# Patient Record
Sex: Female | Born: 1960 | Race: White | Hispanic: No | Marital: Married | Smoking: Never smoker
Health system: Southern US, Community
[De-identification: ages and names within clinical notes are randomized; demographics above are authoritative.]

## PROBLEM LIST (undated history)

## (undated) DIAGNOSIS — T7840XA Allergy, unspecified, initial encounter: Secondary | ICD-10-CM

## (undated) DIAGNOSIS — M797 Fibromyalgia: Secondary | ICD-10-CM

## (undated) DIAGNOSIS — I493 Ventricular premature depolarization: Secondary | ICD-10-CM

## (undated) DIAGNOSIS — M3500 Sicca syndrome, unspecified: Secondary | ICD-10-CM

## (undated) DIAGNOSIS — K589 Irritable bowel syndrome without diarrhea: Secondary | ICD-10-CM

## (undated) HISTORY — PX: ABDOMINAL HYSTERECTOMY: SHX81

## (undated) HISTORY — PX: KNEE SURGERY: SHX244

---

## 2006-08-15 ENCOUNTER — Ambulatory Visit: Payer: Self-pay | Admitting: Internal Medicine

## 2006-09-03 ENCOUNTER — Ambulatory Visit: Payer: Self-pay | Admitting: Internal Medicine

## 2007-11-22 ENCOUNTER — Emergency Department (HOSPITAL_COMMUNITY): Admission: EM | Admit: 2007-11-22 | Discharge: 2007-11-22 | Payer: Self-pay | Admitting: Emergency Medicine

## 2011-01-05 ENCOUNTER — Ambulatory Visit: Payer: Self-pay | Admitting: Internal Medicine

## 2011-04-26 DIAGNOSIS — M3500 Sicca syndrome, unspecified: Secondary | ICD-10-CM | POA: Insufficient documentation

## 2012-07-10 ENCOUNTER — Ambulatory Visit: Payer: Self-pay | Admitting: Internal Medicine

## 2012-11-09 ENCOUNTER — Emergency Department: Payer: Self-pay | Admitting: Unknown Physician Specialty

## 2013-06-13 ENCOUNTER — Ambulatory Visit: Payer: Self-pay | Admitting: Gastroenterology

## 2013-08-19 ENCOUNTER — Ambulatory Visit: Payer: Self-pay | Admitting: Internal Medicine

## 2014-08-21 ENCOUNTER — Ambulatory Visit: Payer: Self-pay | Admitting: Internal Medicine

## 2015-10-13 ENCOUNTER — Other Ambulatory Visit: Payer: Self-pay | Admitting: Internal Medicine

## 2015-10-13 DIAGNOSIS — Z1231 Encounter for screening mammogram for malignant neoplasm of breast: Secondary | ICD-10-CM

## 2015-10-26 ENCOUNTER — Other Ambulatory Visit: Payer: Self-pay | Admitting: Internal Medicine

## 2015-10-26 ENCOUNTER — Ambulatory Visit
Admission: RE | Admit: 2015-10-26 | Discharge: 2015-10-26 | Disposition: A | Discharge status: Home / Self Care | Payer: 59 | Source: Ambulatory Visit | Attending: Internal Medicine | Admitting: Internal Medicine

## 2015-10-26 DIAGNOSIS — Z1231 Encounter for screening mammogram for malignant neoplasm of breast: Secondary | ICD-10-CM

## 2016-01-03 ENCOUNTER — Ambulatory Visit: Payer: 59 | Admitting: Physical Therapy

## 2016-01-06 ENCOUNTER — Encounter: Payer: Self-pay | Admitting: Physical Therapy

## 2016-01-06 NOTE — Therapy (Signed)
[place] [facility] MAIN [facility] SERVICES [address] [place], [place], [address] Phone: [phone]   Fax:  [phone]  Patient Details  Name: Traci Lester MRN: [id number] Date of Birth: 1961/02/25 Referring Provider:  Dr. Renard Matter   Encounter Date: 01/06/2016  Pt arrived to clinic on 01/06/16 at her scheduled appt time but she expressed to PT that she would like to postpone her PT because she is busy caring for her father who is medically ill. Pt was provided information about TEFL teacher as a resource to support her caretaking duties. Pt was also provided a yoga resource online  (www.healingmoves.com for Relax into Yoga video) because she stated her interest in using yoga as a way to manage her Sx.   Pt voiced understanding to call and set up another appt when she is ready to begin PT. No treatment nor charged were applied to her visit today.    Mariane Masters ,PT, DPT, E-RYT  01/06/2016, 3:11 PM  [facility] MAIN [facility] SERVICES [address] [place], [place], [address] Phone: [phone]   Fax:  [phone]

## 2016-07-02 ENCOUNTER — Emergency Department: Payer: 59

## 2016-07-02 ENCOUNTER — Encounter: Payer: Self-pay | Admitting: Emergency Medicine

## 2016-07-02 ENCOUNTER — Observation Stay
Admission: EM | Admit: 2016-07-02 | Discharge: 2016-07-04 | Disposition: A | Discharge status: Home / Self Care | Payer: 59 | Attending: Internal Medicine | Admitting: Internal Medicine

## 2016-07-02 DIAGNOSIS — Z90710 Acquired absence of both cervix and uterus: Secondary | ICD-10-CM | POA: Diagnosis not present

## 2016-07-02 DIAGNOSIS — R000 Tachycardia, unspecified: Secondary | ICD-10-CM | POA: Diagnosis not present

## 2016-07-02 DIAGNOSIS — E876 Hypokalemia: Secondary | ICD-10-CM | POA: Diagnosis not present

## 2016-07-02 DIAGNOSIS — I700 Atherosclerosis of aorta: Secondary | ICD-10-CM | POA: Insufficient documentation

## 2016-07-02 DIAGNOSIS — A0811 Acute gastroenteropathy due to Norwalk agent: Principal | ICD-10-CM | POA: Insufficient documentation

## 2016-07-02 DIAGNOSIS — E860 Dehydration: Secondary | ICD-10-CM

## 2016-07-02 DIAGNOSIS — Z79899 Other long term (current) drug therapy: Secondary | ICD-10-CM | POA: Insufficient documentation

## 2016-07-02 DIAGNOSIS — M797 Fibromyalgia: Secondary | ICD-10-CM | POA: Diagnosis not present

## 2016-07-02 DIAGNOSIS — K449 Diaphragmatic hernia without obstruction or gangrene: Secondary | ICD-10-CM | POA: Diagnosis not present

## 2016-07-02 DIAGNOSIS — F419 Anxiety disorder, unspecified: Secondary | ICD-10-CM | POA: Insufficient documentation

## 2016-07-02 DIAGNOSIS — Z791 Long term (current) use of non-steroidal anti-inflammatories (NSAID): Secondary | ICD-10-CM | POA: Diagnosis not present

## 2016-07-02 DIAGNOSIS — R509 Fever, unspecified: Secondary | ICD-10-CM | POA: Diagnosis not present

## 2016-07-02 DIAGNOSIS — R109 Unspecified abdominal pain: Secondary | ICD-10-CM

## 2016-07-02 DIAGNOSIS — R112 Nausea with vomiting, unspecified: Secondary | ICD-10-CM | POA: Diagnosis present

## 2016-07-02 DIAGNOSIS — K589 Irritable bowel syndrome without diarrhea: Secondary | ICD-10-CM | POA: Insufficient documentation

## 2016-07-02 DIAGNOSIS — K529 Noninfective gastroenteritis and colitis, unspecified: Secondary | ICD-10-CM | POA: Diagnosis present

## 2016-07-02 DIAGNOSIS — R197 Diarrhea, unspecified: Secondary | ICD-10-CM

## 2016-07-02 HISTORY — DX: Allergy, unspecified, initial encounter: T78.40XA

## 2016-07-02 HISTORY — DX: Ventricular premature depolarization: I49.3

## 2016-07-02 HISTORY — DX: Irritable bowel syndrome, unspecified: K58.9

## 2016-07-02 HISTORY — DX: Fibromyalgia: M79.7

## 2016-07-02 LAB — INFLUENZA PANEL BY PCR (TYPE A & B)
Influenza A By PCR: NEGATIVE
Influenza B By PCR: NEGATIVE

## 2016-07-02 LAB — COMPREHENSIVE METABOLIC PANEL
ALBUMIN: 4.4 g/dL (ref 3.5–5.0)
ALT: 38 U/L (ref 14–54)
AST: 37 U/L (ref 15–41)
Alkaline Phosphatase: 62 U/L (ref 38–126)
Anion gap: 9 (ref 5–15)
BUN: 15 mg/dL (ref 6–20)
CHLORIDE: 107 mmol/L (ref 101–111)
CO2: 22 mmol/L (ref 22–32)
CREATININE: 0.73 mg/dL (ref 0.44–1.00)
Calcium: 9.2 mg/dL (ref 8.9–10.3)
GFR calc Af Amer: >60 mL/min (ref >60)
GFR calc non Af Amer: >60 mL/min (ref >60)
Glucose, Bld: 127 mg/dL — ABNORMAL HIGH (ref 65–99)
Potassium: 3.3 mmol/L — ABNORMAL LOW (ref 3.5–5.1)
SODIUM: 138 mmol/L (ref 135–145)
Total Bilirubin: 0.3 mg/dL (ref 0.3–1.2)
Total Protein: 8.2 g/dL — ABNORMAL HIGH (ref 6.5–8.1)

## 2016-07-02 LAB — CBC
HEMATOCRIT: 41.6 % (ref 35.0–47.0)
Hemoglobin: 14.5 g/dL (ref 12.0–16.0)
MCH: 31.3 pg (ref 26.0–34.0)
MCHC: 34.9 g/dL (ref 32.0–36.0)
MCV: 89.5 fL (ref 80.0–100.0)
PLATELETS: 204 10*3/uL (ref 150–440)
RBC: 4.64 MIL/uL (ref 3.80–5.20)
RDW: 13.7 % (ref 11.5–14.5)
WBC: 8.8 10*3/uL (ref 3.6–11.0)

## 2016-07-02 LAB — LIPASE, BLOOD: LIPASE: 25 U/L (ref 11–51)

## 2016-07-02 LAB — LACTIC ACID, PLASMA: Lactic Acid, Venous: 4.4 mmol/L (ref 0.5–1.9)

## 2016-07-02 MED ORDER — ONDANSETRON HCL 4 MG/2ML IJ SOLN
4.0000 mg | Freq: Once | INTRAMUSCULAR | Status: AC | PRN
  Administered 2016-07-02: 4 mg via INTRAVENOUS
  Filled 2016-07-02: qty 2

## 2016-07-02 MED ORDER — SODIUM CHLORIDE 0.9 % IV BOLUS (SEPSIS)
1000.0000 mL | Freq: Once | INTRAVENOUS | Status: AC
  Administered 2016-07-02: 1000 mL via INTRAVENOUS

## 2016-07-02 MED ORDER — IOPAMIDOL (ISOVUE-300) INJECTION 61%: 15.0000 mL | INTRAVENOUS | Status: DC

## 2016-07-02 NOTE — ED Notes (Signed)
ED Provider at bedside. 

## 2016-07-02 NOTE — ED Provider Notes (Addendum)
[facility] Emergency Department Provider Note  ____________________________________________   I have reviewed the triage vital signs and the nursing notes.   HISTORY  Chief Complaint Nausea; Emesis; Diarrhea; and Abdominal Pain    HPI Traci Lester is a 56 y.o. female a history of irritable bowel syndrome, fibromyalgia, anxiety, presents today complaining of sudden onset abdominal pain, copious diarrhea and vomiting that started around 6 PM today. She states she is feeling pretty good up to that time. Positive fever. Multiple sick contacts in the community with norvirus, and positive sick contacts are noted. Patient has had no hematemesis or bright red blood per rectum. Patient was very anxious in the waiting room. She is breathing very quickly and had cramping in her fingers. That is completely resolved at this time. She denies any significant headache. Diffuse abdominal cramping is improved. Patient denies any antibiotics or recent travel.     Past Medical History:  Diagnosis Date  . Fibromyalgia   . IBS (irritable bowel syndrome)   . PVC (premature ventricular contraction)     There are no active problems to display for this patient.   Past Surgical History:  Procedure Laterality Date  . ABDOMINAL HYSTERECTOMY    . CESAREAN SECTION     times 3  . KNEE SURGERY      Prior to Admission medications   Medication Sig Start Date End Date Taking? Authorizing Provider  ALPRAZolam Prudy Feeler) 0.25 MG tablet Take 0.25 mg by mouth at bedtime as needed for anxiety or sleep.   Yes Historical Provider, MD  amitriptyline (ELAVIL) 25 MG tablet Take 25 mg by mouth at bedtime.   Yes Historical Provider, MD  conjugated estrogens (PREMARIN) vaginal cream Place 0.5 mg vaginally daily.   Yes Historical Provider, MD  ibuprofen (ADVIL,MOTRIN) 200 MG tablet Take 200 mg by mouth every 6 (six) hours as needed for fever, headache or moderate pain.   Yes Historical  Provider, MD  Multiple Vitamin (MULTIVITAMIN WITH MINERALS) TABS tablet Take 1 tablet by mouth daily.   Yes Historical Provider, MD  raloxifene (EVISTA) 60 MG tablet Take 60 mg by mouth daily.   Yes Historical Provider, MD  ranitidine (ZANTAC) 75 MG tablet Take 75 mg by mouth daily.    Yes Historical Provider, MD  zolpidem (AMBIEN) 10 MG tablet Take 10 mg by mouth at bedtime as needed for sleep.   Yes Historical Provider, MD    Allergies Codeine; Sulfa antibiotics; and Ultram [tramadol hcl]  Family History  Problem Relation Age of Onset  . Breast cancer Mother 21  . Breast cancer Maternal Aunt 61    Social History Social History  Substance Use Topics  . Smoking status: Never Smoker  . Smokeless tobacco: Never Used  . Alcohol use Not on file    Review of Systems Constitutional: No fever/chills Eyes: No visual changes. ENT: No sore throat. No stiff neck no neck pain Cardiovascular: Denies chest pain. Respiratory: Denies shortness of breath. Gastrointestinal:   See history of present illness Genitourinary: Negative for dysuria. Musculoskeletal: Negative lower extremity swelling Skin: Negative for rash. Neurological: Negative for severe headaches, focal weakness or numbness. 10-point ROS otherwise negative.  ____________________________________________   PHYSICAL EXAM:  VITAL SIGNS: ED Triage Vitals  Enc Vitals Group     BP 07/02/16 2134 128/80     Pulse Rate 07/02/16 2134 (!) 121     Resp 07/02/16 2134 (!) 26     Temp 07/02/16 2134 (!) 101.6 F (38.7 C)  Temp Source 07/02/16 2134 Oral     SpO2 07/02/16 2134 100 %     Weight 07/02/16 2135 140 lb (63.5 kg)     Height 07/02/16 2135 5\' 4"  (1.626 m)     Head Circumference --      Peak Flow --      Pain Score 07/02/16 2136 3     Pain Loc --      Pain Edu? --      Excl. in GC? --     Constitutional: Alert and oriented. She is medically quite stable appearing and nontoxic she is quite anxious Eyes: Conjunctivae  are normal. PERRL. EOMI. Head: Atraumatic. Nose: No congestion/rhinnorhea. Mouth/Throat: Mucous membranes are moist.  Oropharynx non-erythematous. Neck: No stridor.   Nontender with no meningismus Cardiovascular: Normal rate, regular rhythm. Grossly normal heart sounds.  Good peripheral circulation. Respiratory: Normal respiratory effort.  No retractions. Lungs CTAB. Abdominal: Soft and I'll diffuse nonfocal tenderness, nonsurgical abdomen. No distention. No guarding no rebound Back:  There is no focal tenderness or step off.  there is no midline tenderness there are no lesions noted. there is no CVA tenderness Musculoskeletal: No lower extremity tenderness, no upper extremity tenderness. No joint effusions, no DVT signs strong distal pulses no edema Neurologic:  Normal speech and language. No gross focal neurologic deficits are appreciated.  Skin:  Skin is warm, dry and intact. No rash noted. Psychiatric: Mood and affect are anxious. Speech and behavior are normal.  ____________________________________________   LABS (all labs ordered are listed, but only abnormal results are displayed)  Labs Reviewed  COMPREHENSIVE METABOLIC PANEL - Abnormal; Notable for the following:       Result Value   Potassium 3.3 (*)    Glucose, Bld 127 (*)    Total Protein 8.2 (*)    All other components within normal limits  LACTIC ACID, PLASMA - Abnormal; Notable for the following:    Lactic Acid, Venous 4.4 (*)    All other components within normal limits  GASTROINTESTINAL PANEL BY PCR, STOOL (REPLACES STOOL CULTURE)  LIPASE, BLOOD  CBC  URINALYSIS, COMPLETE (UACMP) WITH MICROSCOPIC  LACTIC ACID, PLASMA  INFLUENZA PANEL BY PCR (TYPE A & B)   ____________________________________________  EKG  I personally interpreted any EKGs ordered by me or triage  ____Sinus rhythm rate 99 bpm no acute ST elevation or depression normal axis ________________________________________  RADIOLOGY  I reviewed any  imaging ordered by me or triage that were performed during my shift and, if possible, patient and/or family made aware of any abnormal findings. ____________________________________________   PROCEDURES  Procedure(s) performed: None  Procedures  Critical Care performed: None  ____________________________________________   INITIAL IMPRESSION / ASSESSMENT AND PLAN / ED COURSE  Pertinent labs & imaging results that were available during my care of the patient were reviewed by me and considered in my medical decision making (see chart for details).  Patient with most likely viral gastroenteritis with fever vomiting and diarrhea. However, given elevated lactic acid CT scan was ordered by prior physician. If that is negative is my hope that we can get her safely home.  ----------------------------------------- 4:25 AM on 07/03/2016 -----------------------------------------  Workup is reassuring patient remains persistently mildly tachycardic despite 2 L of fluid. Her initial lactate was elevated but that is come back and certainly. I think some of that certainly could be treatable to her initial presentation. Certainly no evidence of ongoing sepsis I do believe this is a viral pathology resulting in diarrhea and  intravascular depletion with fever.    ____________________________________________   FINAL CLINICAL IMPRESSION(S) / ED DIAGNOSES  Final diagnoses:  None      This chart was dictated using voice recognition software.  Despite best efforts to proofread,  errors can occur which can change meaning.      Jeanmarie Plant, MD 07/02/16 2352    Jeanmarie Plant, MD 07/03/16 [id number]

## 2016-07-02 NOTE — ED Notes (Signed)
Patient had near syncopal episode in lobby. Pt reports she became hot, diaphoretic, dizzy, vision became dark and knees became weak

## 2016-07-02 NOTE — ED Triage Notes (Signed)
Patient with complaint of nausea, vomiting, diarrhea, fever and abdominal pain near her umbilicus that started around 18:00 tonight.

## 2016-07-02 NOTE — ED Notes (Signed)
Patient c/o N/V/D, abdominal pain, headache. Pt's bilateral wrists are contracted towards body.

## 2016-07-03 ENCOUNTER — Encounter: Payer: Self-pay | Admitting: Radiology

## 2016-07-03 ENCOUNTER — Emergency Department: Payer: 59

## 2016-07-03 DIAGNOSIS — K529 Noninfective gastroenteritis and colitis, unspecified: Secondary | ICD-10-CM | POA: Diagnosis present

## 2016-07-03 DIAGNOSIS — A0811 Acute gastroenteropathy due to Norwalk agent: Secondary | ICD-10-CM | POA: Diagnosis not present

## 2016-07-03 LAB — GASTROINTESTINAL PANEL BY PCR, STOOL (REPLACES STOOL CULTURE)

## 2016-07-03 LAB — URINALYSIS, COMPLETE (UACMP) WITH MICROSCOPIC
BACTERIA UA: NONE SEEN
BILIRUBIN URINE: NEGATIVE
Glucose, UA: NEGATIVE mg/dL
HGB URINE DIPSTICK: NEGATIVE
KETONES UR: 5 mg/dL — AB
LEUKOCYTES UA: NEGATIVE
NITRITE: NEGATIVE
PROTEIN: NEGATIVE mg/dL
SPECIFIC GRAVITY, URINE: 1.011 (ref 1.005–1.030)
pH: 8 (ref 5.0–8.0)

## 2016-07-03 LAB — LACTIC ACID, PLASMA: LACTIC ACID, VENOUS: 1.6 mmol/L (ref 0.5–1.9)

## 2016-07-03 LAB — TSH: TSH: 0.739 u[IU]/mL (ref 0.350–4.500)

## 2016-07-03 LAB — MAGNESIUM: Magnesium: 1.9 mg/dL (ref 1.7–2.4)

## 2016-07-03 MED ORDER — ACETAMINOPHEN 650 MG RE SUPP: 650.0000 mg | Freq: Four times a day (QID) | RECTAL | Status: DC | PRN

## 2016-07-03 MED ORDER — ONDANSETRON HCL 4 MG/2ML IJ SOLN: 4.0000 mg | Freq: Four times a day (QID) | INTRAMUSCULAR | Status: DC | PRN

## 2016-07-03 MED ORDER — FAMOTIDINE 20 MG PO TABS
20.0000 mg | ORAL_TABLET | Freq: Every day | ORAL | Status: DC
  Administered 2016-07-03 – 2016-07-04 (×2): 20 mg via ORAL
  Filled 2016-07-03 (×2): qty 1

## 2016-07-03 MED ORDER — SODIUM CHLORIDE 0.9 % IV BOLUS (SEPSIS)
1000.0000 mL | Freq: Once | INTRAVENOUS | Status: AC
  Administered 2016-07-03: 1000 mL via INTRAVENOUS

## 2016-07-03 MED ORDER — POTASSIUM CHLORIDE CRYS ER 20 MEQ PO TBCR
40.0000 meq | EXTENDED_RELEASE_TABLET | ORAL | Status: AC
  Administered 2016-07-03: 40 meq via ORAL
  Filled 2016-07-03: qty 2

## 2016-07-03 MED ORDER — ADULT MULTIVITAMIN W/MINERALS CH
1.0000 | ORAL_TABLET | Freq: Every day | ORAL | Status: DC
  Administered 2016-07-03 – 2016-07-04 (×2): 1 via ORAL
  Filled 2016-07-03 (×2): qty 1

## 2016-07-03 MED ORDER — ENOXAPARIN SODIUM 40 MG/0.4ML ~~LOC~~ SOLN
40.0000 mg | SUBCUTANEOUS | Status: DC
  Administered 2016-07-03: 10:00:00 40 mg via SUBCUTANEOUS
  Filled 2016-07-03 (×2): qty 0.4

## 2016-07-03 MED ORDER — ALPRAZOLAM 0.25 MG PO TABS: 0.2500 mg | ORAL_TABLET | Freq: Every evening | ORAL | Status: DC | PRN

## 2016-07-03 MED ORDER — PROMETHAZINE HCL 25 MG/ML IJ SOLN
12.5000 mg | Freq: Once | INTRAMUSCULAR | Status: AC
  Administered 2016-07-03: 12.5 mg via INTRAVENOUS
  Filled 2016-07-03: qty 1

## 2016-07-03 MED ORDER — RALOXIFENE HCL 60 MG PO TABS
60.0000 mg | ORAL_TABLET | Freq: Every day | ORAL | Status: DC
  Administered 2016-07-03 – 2016-07-04 (×2): 60 mg via ORAL
  Filled 2016-07-03 (×2): qty 1

## 2016-07-03 MED ORDER — POTASSIUM CHLORIDE CRYS ER 20 MEQ PO TBCR
40.0000 meq | EXTENDED_RELEASE_TABLET | Freq: Once | ORAL | Status: AC
  Administered 2016-07-03: 40 meq via ORAL
  Filled 2016-07-03: qty 2

## 2016-07-03 MED ORDER — ZOLPIDEM TARTRATE 5 MG PO TABS: 5.0000 mg | ORAL_TABLET | Freq: Every evening | ORAL | Status: DC | PRN

## 2016-07-03 MED ORDER — IBUPROFEN 200 MG PO TABS
200.0000 mg | ORAL_TABLET | Freq: Four times a day (QID) | ORAL | Status: DC | PRN
  Filled 2016-07-03: qty 1

## 2016-07-03 MED ORDER — ACETAMINOPHEN 325 MG PO TABS
650.0000 mg | ORAL_TABLET | Freq: Four times a day (QID) | ORAL | Status: DC | PRN
  Administered 2016-07-03: 18:00:00 650 mg via ORAL
  Filled 2016-07-03: qty 2

## 2016-07-03 MED ORDER — DOCUSATE SODIUM 100 MG PO CAPS
100.0000 mg | ORAL_CAPSULE | Freq: Two times a day (BID) | ORAL | Status: DC
  Filled 2016-07-03: qty 1

## 2016-07-03 MED ORDER — IOPAMIDOL (ISOVUE-300) INJECTION 61%
100.0000 mL | Freq: Once | INTRAVENOUS | Status: AC | PRN
  Administered 2016-07-03: 100 mL via INTRAVENOUS

## 2016-07-03 MED ORDER — AMITRIPTYLINE HCL 25 MG PO TABS
25.0000 mg | ORAL_TABLET | Freq: Every day | ORAL | Status: DC
  Administered 2016-07-03: 21:00:00 25 mg via ORAL
  Filled 2016-07-03: qty 1

## 2016-07-03 MED ORDER — ONDANSETRON HCL 4 MG PO TABS: 4.0000 mg | ORAL_TABLET | Freq: Four times a day (QID) | ORAL | Status: DC | PRN

## 2016-07-03 MED ORDER — IBUPROFEN 600 MG PO TABS
600.0000 mg | ORAL_TABLET | Freq: Once | ORAL | Status: AC
  Administered 2016-07-03: 600 mg via ORAL
  Filled 2016-07-03: qty 1

## 2016-07-03 MED ORDER — SODIUM CHLORIDE 0.9 % IV SOLN
INTRAVENOUS | Status: DC
  Administered 2016-07-03: 10:00:00 via INTRAVENOUS

## 2016-07-03 NOTE — Progress Notes (Signed)
Continues to have watery diarrhea. Some nausea but no vomiting. Mild abdominal cramping. Afebrile.  * Norovirus diarrhea Symptomatically treatment. IV fluids. Replace potassium.

## 2016-07-03 NOTE — Progress Notes (Signed)
Dr Elpidio AnisSudini notified that patient is positive for norovirus, G1 and G2. MD acknowledged, no new orders given.

## 2016-07-03 NOTE — H&P (Signed)
Traci Lester is an 56 y.o. female.   Chief Complaint: Nausea and vomiting HPI: The patient with past medical history of fibromyalgia and irritable bowel syndrome presents emergency department complaining of nausea, vomiting and diarrhea. She also states that her abdomen is tender and she feels greatly weak. Symptoms have been waxing and waning in severity with overall worsening for several days. Upon arrival the patient had a fever of 101.26F as well as tachycardia with intermittent tachypnea. However lactic acid was normal and the patient had no leukocytosis. She was also found to have significant hypokalemia for  which the emergency department called the hospitalist service for admission.  Past Medical History:  Diagnosis Date  . Fibromyalgia   . IBS (irritable bowel syndrome)   . PVC (premature ventricular contraction)     Past Surgical History:  Procedure Laterality Date  . ABDOMINAL HYSTERECTOMY    . CESAREAN SECTION     times 3  . KNEE SURGERY      Family History  Problem Relation Age of Onset  . Breast cancer Mother 44  . Breast cancer Maternal Aunt 79   Social History:  reports that she has never smoked. She has never used smokeless tobacco. Her alcohol and drug histories are not on file.  Allergies:  Allergies  Allergen Reactions  . Codeine   . Sulfa Antibiotics   . Ultram [Tramadol Hcl]      (Not in a hospital admission)  Results for orders placed or performed during the hospital encounter of 07/02/16 (from the past 48 hour(s))  Lipase, blood     Status: None   Collection Time: 07/02/16  9:45 PM  Result Value Ref Range   Lipase 25 11 - 51 U/L  Comprehensive metabolic panel     Status: Abnormal   Collection Time: 07/02/16  9:45 PM  Result Value Ref Range   Sodium 138 135 - 145 mmol/L   Potassium 3.3 (L) 3.5 - 5.1 mmol/L   Chloride 107 101 - 111 mmol/L   CO2 22 22 - 32 mmol/L   Glucose, Bld 127 (H) 65 - 99 mg/dL   BUN 15 6 - 20 mg/dL   Creatinine,  Ser 0.73 0.44 - 1.00 mg/dL   Calcium 9.2 8.9 - 10.3 mg/dL   Total Protein 8.2 (H) 6.5 - 8.1 g/dL   Albumin 4.4 3.5 - 5.0 g/dL   AST 37 15 - 41 U/L   ALT 38 14 - 54 U/L   Alkaline Phosphatase 62 38 - 126 U/L   Total Bilirubin 0.3 0.3 - 1.2 mg/dL   GFR calc non Af Amer >60 >60 mL/min   GFR calc Af Amer >60 >60 mL/min    Comment: (NOTE) The eGFR has been calculated using the CKD EPI equation. This calculation has not been validated in all clinical situations. eGFR's persistently <60 mL/min signify possible Chronic Kidney Disease.    Anion gap 9 5 - 15  CBC     Status: None   Collection Time: 07/02/16  9:45 PM  Result Value Ref Range   WBC 8.8 3.6 - 11.0 K/uL   RBC 4.64 3.80 - 5.20 MIL/uL   Hemoglobin 14.5 12.0 - 16.0 g/dL   HCT 41.6 35.0 - 47.0 %   MCV 89.5 80.0 - 100.0 fL   MCH 31.3 26.0 - 34.0 pg   MCHC 34.9 32.0 - 36.0 g/dL   RDW 13.7 11.5 - 14.5 %   Platelets 204 150 - 440 K/uL  Magnesium  Status: None   Collection Time: 07/02/16  9:45 PM  Result Value Ref Range   Magnesium 1.9 1.7 - 2.4 mg/dL  Lactic acid, plasma     Status: Abnormal   Collection Time: 07/02/16  9:59 PM  Result Value Ref Range   Lactic Acid, Venous 4.4 (HH) 0.5 - 1.9 mmol/L    Comment: CRITICAL RESULT CALLED TO, READ BACK BY AND VERIFIED WITH JENNA ELLINGTON AT 2236 07/02/16.PMH  Urinalysis, Complete w Microscopic     Status: Abnormal   Collection Time: 07/02/16 10:22 PM  Result Value Ref Range   Color, Urine YELLOW (A) YELLOW   APPearance CLEAR (A) CLEAR   Specific Gravity, Urine 1.011 1.005 - 1.030   pH 8.0 5.0 - 8.0   Glucose, UA NEGATIVE NEGATIVE mg/dL   Hgb urine dipstick NEGATIVE NEGATIVE   Bilirubin Urine NEGATIVE NEGATIVE   Ketones, ur 5 (A) NEGATIVE mg/dL   Protein, ur NEGATIVE NEGATIVE mg/dL   Nitrite NEGATIVE NEGATIVE   Leukocytes, UA NEGATIVE NEGATIVE   RBC / HPF 0-5 0 - 5 RBC/hpf   WBC, UA 0-5 0 - 5 WBC/hpf   Bacteria, UA NONE SEEN NONE SEEN   Squamous Epithelial / LPF 0-5 (A)  NONE SEEN  Influenza panel by PCR (type A & B)     Status: None   Collection Time: 07/02/16 11:06 PM  Result Value Ref Range   Influenza A By PCR NEGATIVE NEGATIVE   Influenza B By PCR NEGATIVE NEGATIVE    Comment: (NOTE) The Xpert Xpress Flu assay is intended as an aid in the diagnosis of  influenza and should not be used as a sole basis for treatment.  This  assay is FDA approved for nasopharyngeal swab specimens only. Nasal  washings and aspirates are unacceptable for Xpert Xpress Flu testing.   Lactic acid, plasma     Status: None   Collection Time: 07/03/16  1:37 AM  Result Value Ref Range   Lactic Acid, Venous 1.6 0.5 - 1.9 mmol/L   Dg Chest 2 View  Result Date: 07/02/2016 CLINICAL DATA:  Initial evaluation for acute fever. EXAM: CHEST  2 VIEW COMPARISON:  None available. FINDINGS: The cardiac and mediastinal silhouettes are within normal limits. Minimal plaque noted within the aortic arch. The lungs are normally inflated. No airspace consolidation, pleural effusion, or pulmonary edema is identified. There is no pneumothorax. No acute osseous abnormality identified. IMPRESSION: 1. No radiographic evidence for active cardiopulmonary disease. 2. Mild aortic atherosclerosis. Electronically Signed   By: Jeannine Boga M.D.   On: 07/02/2016 23:23   Ct Abdomen Pelvis W Contrast  Result Date: 07/03/2016 CLINICAL DATA:  Lower abdominal pain, onset 6 hours prior. Fever. Nausea, vomiting diarrhea. EXAM: CT ABDOMEN AND PELVIS WITH CONTRAST TECHNIQUE: Multidetector CT imaging of the abdomen and pelvis was performed using the standard protocol following bolus administration of intravenous contrast. CONTRAST:  117m ISOVUE-300 IOPAMIDOL (ISOVUE-300) INJECTION 61% COMPARISON:  None. FINDINGS: Lower chest: The lung bases are clear.  Small hiatal hernia. Hepatobiliary: No focal hepatic lesion. Possible tiny gallstone versus gallbladder wall calcification. No biliary dilatation. Pancreas: No ductal  dilatation or inflammation. Spleen: Normal in size without focal abnormality. Adrenals/Urinary Tract: Normal adrenal glands. No hydronephrosis. Symmetric renal enhancement and excretion on delayed phase imaging. No perinephric edema. Bladder is physiologically distended, no wall thickening. Stomach/Bowel: Small hiatal hernia. Stomach as physiologically distended. Mesenteric edema and mesenteric lymph nodes in the central and left upper quadrant. No associated small bowel abnormality. Sigmoid colon is  decompressed, difficult to exclude wall thickening. No definite pericolonic edema. No significant diverticular disease. Portions of the appendix are tentatively identified and normal. There is no pericecal or right lower quadrant inflammation. Vascular/Lymphatic: Normal caliber abdominal aorta. Prominent mesenteric nodes in the region of edema. No retroperitoneal, upper abdominal, or pelvic adenopathy. Reproductive: Status post hysterectomy. No adnexal masses. Other: No free air, free fluid, or intra-abdominal fluid collection. Tiny fat containing umbilical hernia. Minimal fat in the left inguinal canal. Musculoskeletal: Mild broad-based scoliosis of the lumbar spine. There are no acute or suspicious osseous abnormalities. IMPRESSION: 1. Sigmoid colon is decompressed, difficult to exclude wall thickening/mild sigmoid colitis. 2. Mild mesenteric edema with small lymph nodes in the central and left mesentery, may be reactive versus mesenteric adenitis. 3. Tiny gallstone versus gallbladder wall calcification. No pericholecystic inflammation. 4. Small hiatal hernia.  Tiny fat containing umbilical hernia. Electronically Signed   By: Jeb Levering M.D.   On: 07/03/2016 01:18    Review of Systems  Constitutional: Negative for chills and fever.  HENT: Negative for sore throat and tinnitus.   Eyes: Negative for blurred vision and redness.  Respiratory: Negative for cough and shortness of breath.   Cardiovascular:  Negative for chest pain, palpitations, orthopnea and PND.  Gastrointestinal: Positive for abdominal pain, diarrhea, nausea and vomiting.  Genitourinary: Negative for dysuria, frequency and urgency.  Musculoskeletal: Negative for joint pain and myalgias.  Skin: Negative for rash.       No lesions  Neurological: Positive for weakness. Negative for speech change and focal weakness.  Endo/Heme/Allergies: Does not bruise/bleed easily.       No temperature intolerance  Psychiatric/Behavioral: Negative for depression and suicidal ideas.    Blood pressure 105/68, pulse (!) 108, temperature 99.4 F (37.4 C), temperature source Oral, resp. rate 17, height 5' 4"  (1.626 m), weight 63.5 kg (140 lb), SpO2 97 %. Physical Exam  Vitals reviewed. Constitutional: She is oriented to person, place, and time. She appears well-developed and well-nourished. No distress.  HENT:  Head: Normocephalic and atraumatic.  Mouth/Throat: Oropharynx is clear and moist.  Eyes: Conjunctivae and EOM are normal. Pupils are equal, round, and reactive to light. No scleral icterus.  Neck: Normal range of motion. Neck supple. No JVD present. No tracheal deviation present. No thyromegaly present.  Cardiovascular: Normal rate, regular rhythm and normal heart sounds.  Exam reveals no gallop and no friction rub.   No murmur heard. Respiratory: Effort normal and breath sounds normal.  GI: Soft. Bowel sounds are normal. She exhibits no distension. There is no tenderness.  Genitourinary:  Genitourinary Comments: Deferred  Musculoskeletal: Normal range of motion. She exhibits no edema.  Lymphadenopathy:    She has no cervical adenopathy.  Neurological: She is alert and oriented to person, place, and time. No cranial nerve deficit. She exhibits normal muscle tone.  Skin: Skin is warm and dry. No rash noted. No erythema.  Psychiatric: She has a normal mood and affect. Her behavior is normal. Judgment and thought content normal.      Assessment/Plan This is a 56 year old female admitted for gastroenteritis. 1. Gastroenteritis: Likely viral; initially the patient met criteria for sepsis although supportive data is not present for broad-spectrum antibiotics. She is hemodynamically stable and kidney function is good despite clinical dehydration. Place patient on contact an enteric precautions. GI panel pending. 2. Hypokalemia: Replete potassium. Hydrate with intravenous saline 3. DVT prophylaxis: Lovenox 4. GI prophylaxis: H2 blocker per home regimen The patient is a full code. Time spent  on admission orders and patient care approximately 45 minutes  Harrie Foreman, MD 07/03/2016, 7:53 AM

## 2016-07-04 DIAGNOSIS — A0811 Acute gastroenteropathy due to Norwalk agent: Secondary | ICD-10-CM | POA: Diagnosis not present

## 2016-07-04 LAB — BASIC METABOLIC PANEL
ANION GAP: 4 — AB (ref 5–15)
BUN: 5 mg/dL — ABNORMAL LOW (ref 6–20)
CALCIUM: 7.6 mg/dL — AB (ref 8.9–10.3)
CO2: 21 mmol/L — ABNORMAL LOW (ref 22–32)
Chloride: 112 mmol/L — ABNORMAL HIGH (ref 101–111)
Creatinine, Ser: 0.59 mg/dL (ref 0.44–1.00)
GFR calc Af Amer: >60 mL/min (ref >60)
GLUCOSE: 96 mg/dL (ref 65–99)
Potassium: 3.6 mmol/L (ref 3.5–5.1)
Sodium: 137 mmol/L (ref 135–145)

## 2016-07-04 LAB — HEMOGLOBIN A1C
Hgb A1c MFr Bld: 5.3 % (ref 4.8–5.6)
Mean Plasma Glucose: 105 mg/dL

## 2016-07-04 LAB — MAGNESIUM: Magnesium: 1.8 mg/dL (ref 1.7–2.4)

## 2016-07-04 NOTE — Progress Notes (Signed)
While rounding, CH made initial visit to room 130. Pt was in good spirits. Husband was bedside. Pt stated that she was hoping for discharge later today. CH engaged in conversations about family and humor. Pt appreciated the visit and conversation. Pt did not indicate a need spiritual care at this time.    07/04/16 1300  Clinical Encounter Type  Visited With Patient;Patient and family together  Visit Type Initial;Spiritual support  Consult/Referral To Chaplain  Spiritual Encounters  Spiritual Needs Emotional

## 2016-07-04 NOTE — Discharge Summary (Signed)
[employer] - [place] at [facility]   PATIENT NAME: Traci Lester    MR#:  [id number]  DATE OF BIRTH:  1960-06-12  DATE OF ADMISSION:  07/02/2016 ADMITTING PHYSICIAN: Arnaldo NatalMichael S Diamond, MD  DATE OF DISCHARGE: 07/04/2016  2:32 PM  PRIMARY CARE PHYSICIAN: SPARKS,JEFFREY D, MD   ADMISSION DIAGNOSIS:  Dehydration [E86.0] Nausea vomiting and diarrhea [R11.2, R19.7] Abdominal pain, unspecified abdominal location [R10.9]  DISCHARGE DIAGNOSIS:  Active Problems:   Gastroenteritis   SECONDARY DIAGNOSIS:   Past Medical History:  Diagnosis Date  . Allergy   . Fibromyalgia   . IBS (irritable bowel syndrome)   . PVC (premature ventricular contraction)      ADMITTING HISTORY  Chief Complaint: Nausea and vomiting HPI: The patient with past medical history of fibromyalgia and irritable bowel syndrome presents emergency department complaining of nausea, vomiting and diarrhea. She also states that her abdomen is tender and she feels greatly weak. Symptoms have been waxing and waning in severity with overall worsening for several days. Upon arrival the patient had a fever of 101.27F as well as tachycardia with intermittent tachypnea. However lactic acid was normal and the patient had no leukocytosis. She was also found to have significant hypokalemia for  which the emergency department called the hospitalist service for admission.  HOSPITAL COURSE:   * acute gastroenteritis with colitis due to norovirus and sepsis present on admission Patient admitted to the hospital for correction of dehydration and electrolyte abnormalities. Potassium replaced. Diarrhea resolved by day of discharge. He hydration resolved with IV fluids. Patient tolerating regular food without any vomiting. No abdominal pain. Afebrile.  Patient feels back to normal and will be discharged home to follow-up with her primary care physician in one week.  CONSULTS OBTAINED:    DRUG ALLERGIES:   Allergies   Allergen Reactions  . Codeine   . Sulfa Antibiotics   . Ultram [Tramadol Hcl]     DISCHARGE MEDICATIONS:   Discharge Medication List as of 07/04/2016  2:08 PM    CONTINUE these medications which have NOT CHANGED   Details  ALPRAZolam (XANAX) 0.25 MG tablet Take 0.25 mg by mouth at bedtime as needed for anxiety or sleep., Historical Med    amitriptyline (ELAVIL) 25 MG tablet Take 25 mg by mouth at bedtime., Historical Med    conjugated estrogens (PREMARIN) vaginal cream Place 0.5 mg vaginally daily., Historical Med    ibuprofen (ADVIL,MOTRIN) 200 MG tablet Take 200 mg by mouth every 6 (six) hours as needed for fever, headache or moderate pain., Historical Med    Multiple Vitamin (MULTIVITAMIN WITH MINERALS) TABS tablet Take 1 tablet by mouth daily., Historical Med    raloxifene (EVISTA) 60 MG tablet Take 60 mg by mouth daily., Historical Med    ranitidine (ZANTAC) 75 MG tablet Take 75 mg by mouth daily. , Historical Med    zolpidem (AMBIEN) 10 MG tablet Take 10 mg by mouth at bedtime as needed for sleep., Historical Med        Today   VITAL SIGNS:  Blood pressure 129/80, pulse (!) 106, temperature 98.7 F (37.1 C), temperature source Oral, resp. rate 18, height 5\' 4"  (1.626 m), weight 65.3 kg (144 lb), SpO2 98 %.  I/O:   Intake/Output Summary (Last 24 hours) at 07/04/16 1645 Last data filed at 07/04/16 0300  Gross per 24 hour  Intake          1404.17 ml  Output  0 ml  Net          1404.17 ml    PHYSICAL EXAMINATION:  Physical Exam  GENERAL:  56 y.o.-year-old patient lying in the bed with no acute distress.  LUNGS: Normal breath sounds bilaterally, no wheezing, rales,rhonchi or crepitation. No use of accessory muscles of respiration.  CARDIOVASCULAR: S1, S2 normal. No murmurs, rubs, or gallops.  ABDOMEN: Soft, non-tender, non-distended. Bowel sounds present. No organomegaly or mass.  NEUROLOGIC: Moves all 4 extremities. PSYCHIATRIC: The patient is  alert and oriented x 3.  SKIN: No obvious rash, lesion, or ulcer.   DATA REVIEW:   CBC  Recent Labs Lab 07/02/16 2145  WBC 8.8  HGB 14.5  HCT 41.6  PLT 204    Chemistries   Recent Labs Lab 07/02/16 2145 07/04/16 0419  NA 138 137  K 3.3* 3.6  CL 107 112*  CO2 22 21*  GLUCOSE 127* 96  BUN 15 5*  CREATININE 0.73 0.59  CALCIUM 9.2 7.6*  MG 1.9 1.8  AST 37  --   ALT 38  --   ALKPHOS 62  --   BILITOT 0.3  --     Cardiac Enzymes No results for input(s): TROPONINI in the last 168 hours.  Microbiology Results  Results for orders placed or performed during the hospital encounter of 07/02/16  Gastrointestinal Panel by PCR , Stool     Status: Abnormal   Collection Time: 07/03/16  9:28 AM  Result Value Ref Range Status   Campylobacter species NOT DETECTED NOT DETECTED Final   Plesimonas shigelloides NOT DETECTED NOT DETECTED Final   Salmonella species NOT DETECTED NOT DETECTED Final   Yersinia enterocolitica NOT DETECTED NOT DETECTED Final   Vibrio species NOT DETECTED NOT DETECTED Final   Vibrio cholerae NOT DETECTED NOT DETECTED Final   Enteroaggregative E coli (EAEC) NOT DETECTED NOT DETECTED Final   Enteropathogenic E coli (EPEC) NOT DETECTED NOT DETECTED Final   Enterotoxigenic E coli (ETEC) NOT DETECTED NOT DETECTED Final   Shiga like toxin producing E coli (STEC) NOT DETECTED NOT DETECTED Final   Shigella/Enteroinvasive E coli (EIEC) NOT DETECTED NOT DETECTED Final   Cryptosporidium NOT DETECTED NOT DETECTED Final   Cyclospora cayetanensis NOT DETECTED NOT DETECTED Final   Entamoeba histolytica NOT DETECTED NOT DETECTED Final   Giardia lamblia NOT DETECTED NOT DETECTED Final   Adenovirus F40/41 NOT DETECTED NOT DETECTED Final   Astrovirus NOT DETECTED NOT DETECTED Final   Norovirus GI/GII DETECTED (A) NOT DETECTED Final    Comment: RESULT CALLED TO, READ BACK BY AND VERIFIED WITH:  DEDRA MALCOLM AT 1156 07/03/16 SDR    Rotavirus A NOT DETECTED NOT DETECTED  Final   Sapovirus (I, II, IV, and V) NOT DETECTED NOT DETECTED Final    RADIOLOGY:  Dg Chest 2 View  Result Date: 07/02/2016 CLINICAL DATA:  Initial evaluation for acute fever. EXAM: CHEST  2 VIEW COMPARISON:  None available. FINDINGS: The cardiac and mediastinal silhouettes are within normal limits. Minimal plaque noted within the aortic arch. The lungs are normally inflated. No airspace consolidation, pleural effusion, or pulmonary edema is identified. There is no pneumothorax. No acute osseous abnormality identified. IMPRESSION: 1. No radiographic evidence for active cardiopulmonary disease. 2. Mild aortic atherosclerosis. Electronically Signed   By: Rise Mu M.D.   On: 07/02/2016 23:23   Ct Abdomen Pelvis W Contrast  Result Date: 07/03/2016 CLINICAL DATA:  Lower abdominal pain, onset 6 hours prior. Fever. Nausea, vomiting diarrhea. EXAM: CT ABDOMEN  AND PELVIS WITH CONTRAST TECHNIQUE: Multidetector CT imaging of the abdomen and pelvis was performed using the standard protocol following bolus administration of intravenous contrast. CONTRAST:  ISOVUE-300 IOPAMIDOL (ISOVUE-300) INJECTION 61% COMPARISON:  None. FINDINGS: Lower chest: The lung bases are clear.  Small hiatal hernia. Hepatobiliary: No focal hepatic lesion. Possible tiny gallstone versus gallbladder wall calcification. No biliary dilatation. Pancreas: No ductal dilatation or inflammation. Spleen: Normal in size without focal abnormality. Adrenals/Urinary Tract: Normal adrenal glands. No hydronephrosis. Symmetric renal enhancement and excretion on delayed phase imaging. No perinephric edema. Bladder is physiologically distended, no wall thickening. Stomach/Bowel: Small hiatal hernia. Stomach as physiologically distended. Mesenteric edema and mesenteric lymph nodes in the central and left upper quadrant. No associated small bowel abnormality. Sigmoid colon is decompressed, difficult to exclude wall thickening. No definite  pericolonic edema. No significant diverticular disease. Portions of the appendix are tentatively identified and normal. There is no pericecal or right lower quadrant inflammation. Vascular/Lymphatic: Normal caliber abdominal aorta. Prominent mesenteric nodes in the region of edema. No retroperitoneal, upper abdominal, or pelvic adenopathy. Reproductive: Status post hysterectomy. No adnexal masses. Other: No free air, free fluid, or intra-abdominal fluid collection. Tiny fat containing umbilical hernia. Minimal fat in the left inguinal canal. Musculoskeletal: Mild broad-based scoliosis of the lumbar spine. There are no acute or suspicious osseous abnormalities. IMPRESSION: 1. Sigmoid colon is decompressed, difficult to exclude wall thickening/mild sigmoid colitis. 2. Mild mesenteric edema with small lymph nodes in the central and left mesentery, may be reactive versus mesenteric adenitis. 3. Tiny gallstone versus gallbladder wall calcification. No pericholecystic inflammation. 4. Small hiatal hernia.  Tiny fat containing umbilical hernia. Electronically Signed   By: Rubye Oaks M.D.   On: 07/03/2016 01:18    Follow up with PCP in 1 week.  Management plans discussed with the patient, family and they are in agreement.  CODE STATUS:     Code Status Orders        Start     Ordered   07/03/16 0829  Full code  Continuous     07/03/16 0828    Code Status History    Date Active Date Inactive Code Status Order ID Comments User Context   This patient has a current code status but no historical code status.      TOTAL TIME TAKING CARE OF THIS PATIENT ON DAY OF DISCHARGE: more than 30 minutes.   Milagros Loll R M.D on 07/04/2016 at 4:45 PM  Between 7am to 6pm - Pager - [phone]  After 6pm go to [email or web address] - password [email or web address] [facility]  [facility] [place] [facility]  Office  [phone]  CC: Primary care physician; Marguarite Arbour, MD  Note: This dictation was prepared with Dragon  dictation along with smaller phrase technology. Any transcriptional errors that result from this process are unintentional.

## 2016-07-04 NOTE — Discharge Instructions (Addendum)
° °  Abdominal Pain, Adult Many things can cause belly (abdominal) pain. Most times, belly pain is not dangerous. Many cases of belly pain can be watched and treated at home. Sometimes belly pain is serious, though. Your doctor will try to find the cause of your belly pain. Follow these instructions at home:  Take over-the-counter and prescription medicines only as told by your doctor. Do not take medicines that help you poop (laxatives) unless told to by your doctor.  Drink enough fluid to keep your pee (urine) clear or pale yellow.  Watch your belly pain for any changes.  Keep all follow-up visits as told by your doctor. This is important. Contact a doctor if:  Your belly pain changes or gets worse.  You are not hungry, or you lose weight without trying.  You are having trouble pooping (constipated) or have watery poop (diarrhea) for more than 2-3 days.  You have pain when you pee or poop.  Your belly pain wakes you up at night.  Your pain gets worse with meals, after eating, or with certain foods.  You are throwing up and cannot keep anything down.  You have a fever. Get help right away if:  Your pain does not go away as soon as your doctor says it should.  You cannot stop throwing up.  Your pain is only in areas of your belly, such as the right side or the left lower part of the belly.  You have bloody or black poop, or poop that looks like tar.  You have very bad pain, cramping, or bloating in your belly.  You have signs of not having enough fluid or water in your body (dehydration), such as:  Dark pee, very little pee, or no pee.  Cracked lips.  Dry mouth.  Sunken eyes.  Sleepiness.  Weakness. This information is not intended to replace advice given to you by your health care provider. Make sure you discuss any questions you have with your health care provider. Document Released: 11/08/2007 Document Revised: 12/10/2015 Document Reviewed: 11/03/2015 Elsevier  Interactive Patient Education  2017 [employer] Resume diet and activity as before

## 2016-07-04 NOTE — Plan of Care (Signed)
MD making rounds. Received order to discharge home. IV's removed. Provided with education handout. Discharge paperwork provided, explained, signed and witnessed. No unanswered questions. Discharged via wheelchair by auxiliary staff. Belongings sent with patient and family.

## 2016-07-14 ENCOUNTER — Other Ambulatory Visit: Payer: Self-pay | Admitting: Internal Medicine

## 2016-07-14 DIAGNOSIS — R1084 Generalized abdominal pain: Secondary | ICD-10-CM

## 2016-08-18 ENCOUNTER — Other Ambulatory Visit
Admission: RE | Admit: 2016-08-18 | Discharge: 2016-08-18 | Disposition: A | Discharge status: Home / Self Care | Payer: 59 | Source: Ambulatory Visit | Attending: Gastroenterology | Admitting: Gastroenterology

## 2016-08-18 DIAGNOSIS — R197 Diarrhea, unspecified: Secondary | ICD-10-CM | POA: Insufficient documentation

## 2016-08-18 LAB — C DIFFICILE QUICK SCREEN W PCR REFLEX
C DIFFICILE (CDIFF) INTERP: NOT DETECTED
C Diff antigen: NEGATIVE
C Diff toxin: NEGATIVE

## 2016-08-23 LAB — MISCELLANEOUS TEST

## 2016-11-13 ENCOUNTER — Other Ambulatory Visit: Payer: Self-pay | Admitting: Internal Medicine

## 2016-11-13 DIAGNOSIS — Z1231 Encounter for screening mammogram for malignant neoplasm of breast: Secondary | ICD-10-CM

## 2016-11-24 ENCOUNTER — Other Ambulatory Visit: Payer: Self-pay | Admitting: Internal Medicine

## 2016-11-24 DIAGNOSIS — K219 Gastro-esophageal reflux disease without esophagitis: Secondary | ICD-10-CM

## 2016-11-29 ENCOUNTER — Ambulatory Visit
Admission: RE | Admit: 2016-11-29 | Discharge: 2016-11-29 | Disposition: A | Discharge status: Home / Self Care | Payer: 59 | Source: Ambulatory Visit | Attending: Internal Medicine | Admitting: Internal Medicine

## 2016-11-29 DIAGNOSIS — Z1231 Encounter for screening mammogram for malignant neoplasm of breast: Secondary | ICD-10-CM

## 2016-11-30 ENCOUNTER — Ambulatory Visit
Admission: RE | Admit: 2016-11-30 | Discharge: 2016-11-30 | Disposition: A | Discharge status: Home / Self Care | Payer: 59 | Source: Ambulatory Visit | Attending: Internal Medicine | Admitting: Internal Medicine

## 2016-11-30 DIAGNOSIS — K449 Diaphragmatic hernia without obstruction or gangrene: Secondary | ICD-10-CM | POA: Insufficient documentation

## 2016-11-30 DIAGNOSIS — K219 Gastro-esophageal reflux disease without esophagitis: Secondary | ICD-10-CM | POA: Diagnosis present

## 2016-12-18 ENCOUNTER — Encounter: Payer: 59 | Attending: Internal Medicine | Admitting: Dietician

## 2016-12-18 ENCOUNTER — Encounter: Payer: Self-pay | Admitting: Dietician

## 2016-12-18 VITALS — Ht 64.0 in | Wt 139.9 lb

## 2016-12-18 DIAGNOSIS — K589 Irritable bowel syndrome without diarrhea: Secondary | ICD-10-CM | POA: Diagnosis present

## 2016-12-18 DIAGNOSIS — K580 Irritable bowel syndrome with diarrhea: Secondary | ICD-10-CM

## 2016-12-18 DIAGNOSIS — K219 Gastro-esophageal reflux disease without esophagitis: Secondary | ICD-10-CM | POA: Insufficient documentation

## 2016-12-18 DIAGNOSIS — Z713 Dietary counseling and surveillance: Secondary | ICD-10-CM | POA: Diagnosis not present

## 2016-12-18 DIAGNOSIS — M3500 Sicca syndrome, unspecified: Secondary | ICD-10-CM

## 2016-12-18 DIAGNOSIS — M797 Fibromyalgia: Secondary | ICD-10-CM | POA: Insufficient documentation

## 2016-12-18 NOTE — Progress Notes (Signed)
Medical Nutrition Therapy: Visit start time: 1045  end time: 1200  Assessment:  Diagnosis: IBS, Sjogren's syndrome, alpha-gal syndrome Past medical history: GERD, fibromyalgia, osteoporosis Psychosocial issues/ stress concerns: none; taking anti-anxiety medications Preferred learning method:  . Auditory . Visual . Hands-on  Current weight: 139.9lbs  Height: 5'4" Medications, supplements: reconciled list in medical record  Progress and evaluation: Patient reports 18-year history of IBS with diarrhea, at times explosive diarrhea. She has some dry mouth issues due to Sjogren's syndrome, drinks water throughout the day, occasionally chews Orbit gum (with xylitol). She has recently begun Autoimmune Protocol (AIP) diet in effort to fight inflammation and fibromyalgia and Sjogren's symptoms. She has eliminated all grains and some other foods 3 weeks ago, will continue for 1 more week before adding foods back one at a time and monitoring reaction. She reports some noticeable improvement within 2 days of eliminating wheat products, but not more other resolution. Avoiding grains at this time, working on elimination diet for allergeies, intolerances.  Considering trying some sprouted grain oat flour.  Physical activity: walking on treadmill 5 days per week, up to 7 minutes a time as of today  Dietary Intake:  Usual eating pattern includes 3 meals and 0-1 snacks per day. Dining out frequency: 1-2 meals per week.  Breakfast: coconut milk yogurt, dried fruit; small cup coffee with cane sugar and coconut milk Snack: none or fruit Lunch: salad with chicken; chicken salad with avocado, celery, cilantro. Was eating torilla with beans, cheese, mushrooms, salsa. Loves black beans.  Snack: none or fruit Supper: protein chicken, fish, vegetables Snack: none or fruit Beverages: water, usually infused with fruit  Nutrition Care Education: Topics covered: IBS, low fiber diet, alpha-gal syndrome, Sjogren's  syndrome Basic nutrition: appropriate nutrient balance, appropriate meal and snack schedule IBS: importance of eating small amounts often; limiting spicy foods, caffeine, benefits and principles of following low fiber diet to promote healing, and specific low fiber food choices; discussed AIP diet patient is currently following. Alpha-gal syndrome: avoidance of animal products, avoidance of dairy products as patient is experiencing allergic symptoms when consuming dairy. Sjogrens syndrome: anti-inflammatory foods and spices (used in small-moderate amounts due to IBS), gluten free foods, tips for dealing with dry mouth.    Nutritional Diagnosis:  [place]-2.1 Inpaired nutrition utilization As related to Sjogrens syndrome, alpha-gal syndrome.  As evidenced by MD notes, patient report. [place]-1.4 Altered GI function As related to IBS.  As evidenced by MD notes, patient report.  Intervention: Instruction as noted above.   Set goals with input from paitent.   She will continue with AIP for now, and consider low fiber diet.    She will continue with gluten free diet and avoid mammalian meats and dairy products.   She declined scheduling follow-up, but will schedule later if needed.  Education Materials given:  . IBS Nutrition Therapy (NCM) . Fiber-restricted Nutrition Therapy (NCM) . Dry Mouth (NCM) . Goals/ instructions  Learner/ who was taught:  . Patient   Level of understanding: Marland Kitchen. Verbalizes/ demonstrates competency  Demonstrated degree of understanding via:   Teach back Learning barriers: . None  Willingness to learn/ readiness for change: . Eager, change in progress  Monitoring and Evaluation:  Dietary intake, IBS and allergic symptoms, and body weight      follow up: prn

## 2016-12-18 NOTE — Patient Instructions (Signed)
   Continue to avoid gluten-containing foods.   Try probiotic foods and/or supplements, such as tempeh.   Consider low fiber diet for several weeks (6-12) to rest the GI system and allow beneficial bacteria to redevelop.

## 2017-01-15 ENCOUNTER — Ambulatory Visit: Payer: 59 | Admitting: Certified Registered"

## 2017-01-15 ENCOUNTER — Encounter
Admission: RE | Disposition: A | Discharge status: Home / Self Care | Payer: Self-pay | Source: Ambulatory Visit | Attending: Gastroenterology

## 2017-01-15 ENCOUNTER — Encounter: Payer: Self-pay | Admitting: *Deleted

## 2017-01-15 ENCOUNTER — Ambulatory Visit
Admission: RE | Admit: 2017-01-15 | Discharge: 2017-01-15 | Disposition: A | Discharge status: Home / Self Care | Payer: 59 | Source: Ambulatory Visit | Attending: Gastroenterology | Admitting: Gastroenterology

## 2017-01-15 DIAGNOSIS — R1031 Right lower quadrant pain: Secondary | ICD-10-CM | POA: Diagnosis not present

## 2017-01-15 DIAGNOSIS — K9289 Other specified diseases of the digestive system: Secondary | ICD-10-CM | POA: Diagnosis not present

## 2017-01-15 DIAGNOSIS — K621 Rectal polyp: Secondary | ICD-10-CM | POA: Insufficient documentation

## 2017-01-15 DIAGNOSIS — M3500 Sicca syndrome, unspecified: Secondary | ICD-10-CM | POA: Diagnosis not present

## 2017-01-15 DIAGNOSIS — Z79810 Long term (current) use of selective estrogen receptor modulators (SERMs): Secondary | ICD-10-CM | POA: Insufficient documentation

## 2017-01-15 DIAGNOSIS — I493 Ventricular premature depolarization: Secondary | ICD-10-CM | POA: Diagnosis not present

## 2017-01-15 DIAGNOSIS — K640 First degree hemorrhoids: Secondary | ICD-10-CM | POA: Diagnosis not present

## 2017-01-15 DIAGNOSIS — K219 Gastro-esophageal reflux disease without esophagitis: Secondary | ICD-10-CM | POA: Insufficient documentation

## 2017-01-15 DIAGNOSIS — Z79899 Other long term (current) drug therapy: Secondary | ICD-10-CM | POA: Diagnosis not present

## 2017-01-15 DIAGNOSIS — R194 Change in bowel habit: Secondary | ICD-10-CM | POA: Insufficient documentation

## 2017-01-15 DIAGNOSIS — K644 Residual hemorrhoidal skin tags: Secondary | ICD-10-CM | POA: Diagnosis not present

## 2017-01-15 HISTORY — DX: Sjogren syndrome, unspecified: M35.00

## 2017-01-15 HISTORY — PX: COLONOSCOPY WITH PROPOFOL: SHX5780

## 2017-01-15 SURGERY — COLONOSCOPY WITH PROPOFOL
Anesthesia: General

## 2017-01-15 MED ORDER — SODIUM CHLORIDE 0.9 % IV SOLN
INTRAVENOUS | Status: DC
  Administered 2017-01-15: 13:00:00 via INTRAVENOUS

## 2017-01-15 MED ORDER — SODIUM CHLORIDE 0.9 % IV SOLN: INTRAVENOUS | Status: DC

## 2017-01-15 MED ORDER — PROPOFOL 500 MG/50ML IV EMUL
INTRAVENOUS | Status: AC
  Filled 2017-01-15: qty 50

## 2017-01-15 MED ORDER — LIDOCAINE HCL (CARDIAC) 20 MG/ML IV SOLN
INTRAVENOUS | Status: DC | PRN
  Administered 2017-01-15: 50 mg via INTRAVENOUS

## 2017-01-15 MED ORDER — PROPOFOL 500 MG/50ML IV EMUL
INTRAVENOUS | Status: DC | PRN
  Administered 2017-01-15: 125 ug/kg/min via INTRAVENOUS

## 2017-01-15 MED ORDER — PROPOFOL 10 MG/ML IV BOLUS
INTRAVENOUS | Status: DC | PRN
  Administered 2017-01-15: 10 mg via INTRAVENOUS
  Administered 2017-01-15: 50 mg via INTRAVENOUS

## 2017-01-15 NOTE — Anesthesia Preprocedure Evaluation (Signed)
Anesthesia Evaluation  Patient identified by MRN, date of birth, ID band Patient awake    Reviewed: Allergy & Precautions, NPO status , Patient's Chart, lab work & pertinent test results  History of Anesthesia Complications Negative for: history of anesthetic complications  Airway Mallampati: II       Dental   Pulmonary neg pulmonary ROS,           Cardiovascular negative cardio ROS       Neuro/Psych negative neurological ROS     GI/Hepatic Neg liver ROS, GERD  Medicated and Controlled,  Endo/Other  negative endocrine ROS  Renal/GU negative Renal ROS     Musculoskeletal   Abdominal   Peds  Hematology   Anesthesia Other Findings   Reproductive/Obstetrics                            Anesthesia Physical Anesthesia Plan  ASA: II  Anesthesia Plan: General   Post-op Pain Management:    Induction: Intravenous  PONV Risk Score and Plan:   Airway Management Planned:   Additional Equipment:   Intra-op Plan:   Post-operative Plan:   Informed Consent: I have reviewed the patients History and Physical, chart, labs and discussed the procedure including the risks, benefits and alternatives for the proposed anesthesia with the patient or authorized representative who has indicated his/her understanding and acceptance.     Plan Discussed with:   Anesthesia Plan Comments:         Anesthesia Quick Evaluation

## 2017-01-15 NOTE — Anesthesia Procedure Notes (Signed)
Performed by: Nas Wafer Pre-anesthesia Checklist: Patient identified, Emergency Drugs available, Suction available, Patient being monitored and Timeout performed Patient Re-evaluated:Patient Re-evaluated prior to induction Oxygen Delivery Method: Nasal cannula Preoxygenation: Pre-oxygenation with 100% oxygen Induction Type: IV induction       

## 2017-01-15 NOTE — Op Note (Signed)
[facility] Gastroenterology Patient Name: Traci Lester Procedure Date: 01/15/2017 1:35 PM MRN: [id number] Account #: [id number] Date of Birth: 06/10/1960 Admit Type: Outpatient Age: 56 [facility] Gender: Female Note Status: Finalized Procedure:            Colonoscopy Indications:          Abdominal pain in the right lower quadrant, Change in                        bowel habits Providers:            Christena DeemMartin U. Skulskie, MD Referring MD:         Duane LopeJeffrey D. Judithann SheenSparks, MD (Referring MD) Medicines:            Monitored Anesthesia Care Complications:        No immediate complications. Procedure:            Pre-Anesthesia Assessment:                       - ASA Grade Assessment: II - A patient with mild                        systemic disease.                       After obtaining informed consent, the colonoscope was                        passed under direct vision. Throughout the procedure,                        the patient's blood pressure, pulse, and oxygen                        saturations were monitored continuously. The                        Colonoscope was introduced through the anus and                        advanced to the the cecum, identified by appendiceal                        orifice and ileocecal valve. The colonoscopy was                        unusually difficult due to restricted mobility of the                        colon and a tortuous colon. Successful completion of                        the procedure was aided by changing the patient to a                        supine position, changing the patient to a prone                        position, using manual pressure and changing  endoscopes. The patient tolerated the procedure well.                        The quality of the bowel preparation was good. Findings:      A 1 mm polyp was found in the sigmoid colon. The polyp was sessile. The       polyp was  removed with a cold biopsy forceps. Resection and retrieval       were complete.      A 1 mm polyp was found in the rectum. The polyp was sessile. The polyp       was removed with a cold biopsy forceps. Resection and retrieval were       complete.      Biopsies for histology were taken with a cold forceps from the right       colon and left colon for evaluation of microscopic colitis.      The terminal ileum appeared normal.      The exam was otherwise without abnormality.      The digital rectal exam was normal.      The perianal exam findings include small hemorrhoids, non-thrombosed       external hemorrhoids and internal hemorrhoids (Grade I). Impression:           - One 1 mm polyp in the sigmoid colon, removed with a                        cold biopsy forceps. Resected and retrieved.                       - One 1 mm polyp in the rectum, removed with a cold                        biopsy forceps. Resected and retrieved.                       - The examined portion of the ileum was normal.                       - The examination was otherwise normal.                       - Hemorrhoids, non-thrombosed external hemorrhoids and                        internal hemorrhoids (Grade I) found on perianal exam.                       - Biopsies were taken with a cold forceps from the                        right colon and left colon for evaluation of                        microscopic colitis. Recommendation:       - Discharge patient to home.                       - Discharge patient to home.                       -  Await pathology results.                       - Use Citrucel one tablespoon PO daily daily.                       - Return to GI clinic in 1 month. Procedure Code(s):    --- Professional ---                       [phone], Colonoscopy, flexible; with biopsy, single or                        multiple Diagnosis Code(s):    --- Professional ---                       D12.5, Benign  neoplasm of sigmoid colon                       K62.1, Rectal polyp                       K64.0, First degree hemorrhoids                       K64.4, Residual hemorrhoidal skin tags                       R10.31, Right lower quadrant pain                       R19.4, Change in bowel habit CPT copyright 2016 American Medical Association. All rights reserved. The codes documented in this report are preliminary and upon coder review may  be revised to meet current compliance requirements. Christena Deem, MD 01/15/2017 2:30:26 PM This report has been signed electronically. Number of Addenda: 0 Note Initiated On: 01/15/2017 1:35 PM Scope Withdrawal Time: 0 hours 12 minutes 5 seconds  Total Procedure Duration: 0 hours 43 minutes 13 seconds       [facility]

## 2017-01-15 NOTE — H&P (Signed)
Outpatient short stay form Pre-procedure 01/15/2017 1:31 PM Traci Traci Lester Traci Zendejas MD  Primary Physician: Dr. Aram BeechamJeffrey Sparks  Reason for visit:  Colonoscopy  History of present illness:  Patient is a 56 year old female presenting today as above. She has a long history of irritable bowel syndrome however was recently diagnosed after about with [place] virus with alpha galactosemia. She had multiple tick bites and had been having a change of bowel habits. She does have some generalized abdominal discomfort although perhaps mostly lower abdomen. Currently she states that with dietary restraint she is having formed stools. She tolerated her prep well. She takes no aspirin or blood thinning agents.    Current Facility-Administered Medications:  .  0.9 %  sodium chloride infusion, , Intravenous, Continuous, Traci DeemSkulskie, Briseidy Spark U, MD, Last Rate: 20 mL/hr at 01/15/17 1305 .  0.9 %  sodium chloride infusion, , Intravenous, Continuous, Traci DeemSkulskie, Traci Sopher U, MD .  0.9 %  sodium chloride infusion, , Intravenous, Continuous, Traci DeemSkulskie, Traci Molinari U, MD  Prescriptions Prior to Admission  Medication Sig Dispense Refill Last Dose  . ALPRAZolam (XANAX) 0.25 MG tablet Take 0.25 mg by mouth at bedtime as needed for anxiety or sleep.   01/14/2017 at Unknown time  . amitriptyline (ELAVIL) 25 MG tablet Take 25 mg by mouth at bedtime.   Past Week at Unknown time  . Calcium-Vitamin D-Vitamin K (VIACTIV PO) Take by mouth.   01/14/2017 at Unknown time  . conjugated estrogens (PREMARIN) vaginal cream Place 0.5 mg vaginally daily.   Past Week at Unknown time  . esomeprazole (NEXIUM) 40 MG capsule Take by mouth.   Past Month at Unknown time  . ibuprofen (ADVIL,MOTRIN) 200 MG tablet Take 200 mg by mouth every 6 (six) hours as needed for fever, headache or moderate pain.   Past Week at Unknown time  . Multiple Vitamin (MULTIVITAMIN WITH MINERALS) TABS tablet Take 1 tablet by mouth daily.   Past Week at Unknown time  . pilocarpine  (SALAGEN) 5 MG tablet Take by mouth 2 (two) times daily.   01/14/2017 at Unknown time  . raloxifene (EVISTA) 60 MG tablet Take 60 mg by mouth daily.   01/14/2017 at Unknown time  . zolpidem (AMBIEN) 10 MG tablet Take 10 mg by mouth at bedtime as needed for sleep.   Past Week at Unknown time  . ranitidine (ZANTAC) 75 MG tablet Take 75 mg by mouth daily.    Completed Course at Unknown time     Allergies  Allergen Reactions  . Codeine   . Sulfa Antibiotics   . Ultram [Tramadol Hcl]      Past Medical History:  Diagnosis Date  . Allergy   . Fibromyalgia   . IBS (irritable bowel syndrome)   . PVC (premature ventricular contraction)   . Sjogren's syndrome (HCC)     Review of systems:      Physical Exam    Heart and lungs: Regular rate and rhythm without rub or gallop, lungs are bilaterally clear    HEENT: Normocephalic atraumatic eyes are anicteric    Other:     Pertinant exam for procedure: Soft nontender nondistended bowel sounds positive normoactive.    Planned proceedures: Colonoscopy and indicated procedures.    Traci Traci Lester Jaquia Benedicto, MD Gastroenterology 01/15/2017  1:31 PM

## 2017-01-15 NOTE — Anesthesia Postprocedure Evaluation (Signed)
Anesthesia Post Note  Patient: Criss AlvineKatherine V Madara  Procedure(s) Performed: Procedure(s) (LRB): COLONOSCOPY WITH PROPOFOL (N/A)  Patient location during evaluation: Endoscopy Anesthesia Type: General Level of consciousness: awake and alert Pain management: pain level controlled Vital Signs Assessment: post-procedure vital signs reviewed and stable Respiratory status: spontaneous breathing and respiratory function stable Cardiovascular status: stable Anesthetic complications: no     Last Vitals:  Vitals:   01/15/17 1430 01/15/17 1431  BP: 105/66 105/66  Pulse: 80 82  Resp: 16 13  Temp: (!) 35.8 C   SpO2: 100% 100%    Last Pain:  Vitals:   01/15/17 1430  TempSrc: Tympanic  PainSc:                  Lion Fernandez K

## 2017-01-15 NOTE — Anesthesia Post-op Follow-up Note (Signed)
Anesthesia QCDR form completed.        

## 2017-01-15 NOTE — Transfer of Care (Signed)
Immediate Anesthesia Transfer of Care Note  Patient: Traci Lester  Procedure(s) Performed: Procedure(s): COLONOSCOPY WITH PROPOFOL (N/A)  Patient Location: PACU  Anesthesia Type:General  Level of Consciousness: sedated and responds to stimulation  Airway & Oxygen Therapy: Patient Spontanous Breathing and Patient connected to nasal cannula oxygen  Post-op Assessment: Report given to RN and Post -op Vital signs reviewed and stable  Post vital signs: Reviewed and stable  Last Vitals:  Vitals:   01/15/17 1252  BP: (!) 156/87  Pulse: 74  Resp: 16  Temp: 36.4 C  SpO2: 100%    Last Pain:  Vitals:   01/15/17 1252  TempSrc: Tympanic  PainSc: 4          Complications: No apparent anesthesia complications

## 2017-01-16 ENCOUNTER — Encounter: Payer: Self-pay | Admitting: Gastroenterology

## 2017-01-18 LAB — SURGICAL PATHOLOGY

## 2017-04-13 ENCOUNTER — Ambulatory Visit: Payer: 59 | Attending: Internal Medicine | Admitting: Physical Therapy

## 2017-04-13 VITALS — BP 120/68

## 2017-04-13 DIAGNOSIS — R296 Repeated falls: Secondary | ICD-10-CM | POA: Diagnosis present

## 2017-04-13 DIAGNOSIS — R2689 Other abnormalities of gait and mobility: Secondary | ICD-10-CM | POA: Insufficient documentation

## 2017-04-13 DIAGNOSIS — M2170 Unequal limb length (acquired), unspecified site: Secondary | ICD-10-CM

## 2017-04-13 DIAGNOSIS — M6281 Muscle weakness (generalized): Secondary | ICD-10-CM | POA: Diagnosis present

## 2017-04-13 NOTE — Patient Instructions (Addendum)
  Avoid straining pelvic floor, abdominal muscles , spine  Use log rolling technique instead of getting out of bed with your neck or the sit-up   Log rolling out of .bed  L  arm overhead  Raise hips and scoot hips to R   Drop knees to L,  scooting L shoulder back to get completely on your L side so your shoulders, hips, and knees point to the L    Then breathe as you drop feet off bed and prop onto L elbow and  use both hands to push yourself      __________  Wear shoe lift in R shoe    _________ To release sacroiliac joint on R     Standing:  10 reps on both sides x 3 x day     3 point tap   Feet are hip width Tap forward, center under hip not feet next to each other  Tap middle\, center  Tap back     _Figure-4 and then toe touch to the ground behind you along a diagonal    Stretch back without compression spine, instead lengthen     Forward stretch by counter with hips back, knees bents lisght R hand to L hand to for side stretch ( switch) 5 breaths at 3 directions)     __________  Try to sleep on low pillow under neck, and one under knees, and to get accustomed to laying on your back instead of on your belly

## 2017-04-13 NOTE — Therapy (Signed)
[facility] MAIN [facility] SERVICES [address] [place], [place], [address] Phone: [phone]   Fax:  [phone]  Physical Therapy Evaluation  Patient Details  Name: Traci Lester MRN: [id number] Date of Birth: 05/03/1961 Referring Provider: Dr. Renard Matter    Encounter Date: 04/13/2017  PT End of Session - 04/13/17 1004    Visit Number  1    Number of Visits  12    Date for PT Re-Evaluation  07/06/17    PT Start Time  0850    PT Stop Time  1004    PT Time Calculation (min)  74 min       Past Medical History:  Diagnosis Date  . Allergy   . Fibromyalgia   . IBS (irritable bowel syndrome)   . PVC (premature ventricular contraction)   . Sjogren's syndrome [facility])     Past Surgical History:  Procedure Laterality Date  . ABDOMINAL HYSTERECTOMY    . CESAREAN SECTION     times 3  . KNEE SURGERY      Vitals:   04/13/17 0929  BP: 120/68     Subjective Assessment - 04/13/17 0903    Subjective  1) B Neck, hip, shoulder pain due fibromyalgia. Pain started after 1 year of her Dx of Sjorgen's Disease. Pain is mostly achey and ocassionally sharp. "It feels like my muscles are knotted up".  She has difficulty lifting her arm to shave armpits and turning head to change lanes when driving, holding her grand child.  B neck pain level after activities: 6-7/10 without radiaiting / numbness pain.  Shoulder pain R 7/10, L 5/10.  Pt had an injury with R shoulder when walking 85 lb dog and pulling back back on his leash.  Hip pain 3/10 B, radiating down down back of thighs and deep in her hips and muscles that wrap around her R back. Pt has been using spikey ball as recommended by a previous PT to release muscles tensions but it left bruises on her body.  Pt had 3 falls this past 6 months while walking dog and having difficulty clearing her leg to try and not trip over sticks. Modes of relaxation/ stress-reduction: read.  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    2) abdominal scar pain from 3 C-sections and hysterectomy.    3) frequent urination:  Nocturia 2-3x/ night, Daytime: 2x/ per 2 hours.  Denied SUI, bowel functions, dysparuenia.  4) Pt has had difficulty with sleeping starting 18 years ago due to pain in her neck, hip, back. There is only one position that she can sleep in ( on her belly with feet hanging off with a pillow under her hips) .  Pt has to take medication ( Ambien, Xanax, Amiltrotyline)  which allows for 4 hrs of sleep. Pt also has had PVC epsiodes starting 4-5 years ago. Pt is wearing a Holter monitor today for 1 month in order to monitor when her tachycardia and PVC occur. Pt has neuropathy in her feet because she can not tolerate the touch of a surface on her heels which leads her to not tolerate sleeping on her back nor her sides. Pt has not been screened for a sleep study. Pt's husband tells her pt snores.        Pertinent History  auto-immune disease Sjorgen's , cardiac issues ( PVC/ tachycardia), fibromyalgia, neuropathy in B feet, osteoporosis  Patient Stated Goals  to be able to shave underarms and to gaze out car window without pain          [facility] PT Assessment - 04/13/17 0914      Assessment   Medical Diagnosis  Fibro     Referring Provider  Dr. Renard MatterBock       Precautions   Precautions  None      Restrictions   Weight Bearing Restrictions  No      Balance Screen   Has the patient fallen in the past 6 months  Yes 3, tripping over sticks when walking dog, could not clear      Observation/Other Assessments   Observations  B ASIS to medial malleoi 88 cm       Posture/Postural Control   Posture Comments  forward head: earlobn to acromium 18.5 cm R, 17.5 cm L, increased thoracic kyphosis        AROM   Overall AROM Comments  Lsidebend 40deg, R 50 deg, rotation L 45 deg, R 50 deg R         PROM   Overall PROM Comments  R hip IR ~45 deg, L  ~55 deg       Strength   Overall Strength Comments  B hip flex 4-/5, hip abd R 3/5       Palpation   SI assessment   R ASIS more posterior.  L lower rib and L ASIS and malleoli medial higher than R, hypomobility at R SIJ into hip ext    tenderness at  R PSIS    Palpation comment  abdominal scar restrictions       Bed Mobility   Bed Mobility  -- crunch moethod              Objective measurements completed on examination: See above findings.      [facility] Adult PT Treatment/Exercise - 04/13/17 0914      Neuro Re-ed    Neuro Re-ed Details   see pt instructions       Manual Therapy   Manual Therapy  -- provided R shoe lift     Manual therapy comments  long axis distraction on R LE,  supreior mob of sacrum, MWM at lateral sacrum,.   fascial release at abdominal scar              PT Education - 04/13/17 1045    Education provided  Yes    Education Details  POC, anatomy. physiology, goals, HEP    Person(s) Educated  Patient    Methods  Explanation;Demonstration;Tactile cues;Verbal cues;Handout    Comprehension  Returned demonstration;Verbalized understanding;Verbal cues required;Tactile cues required          PT Long Term Goals - 04/13/17 1038      PT LONG TERM GOAL #1   Title  Pt will show less forward head posture/ thoracic kyphosis with increased distance between earlobe to acromium 18.5 cm R, 17.5 cm L to < 17 cm B in order to minimize risk for compression Fx with osteoporosis and to improve postural stability to m inimize neck , shoulder, and hip pain to sleep    Time  12    Period  Weeks    Status  New    Target Date  07/06/17      PT LONG TERM GOAL #2   Title  Pt will decrease her NDI score from 54% to <49 % in order to shave underarms and turn head when  driving    Time  12    Period  Weeks    Status  New    Target Date  07/06/17      PT LONG TERM GOAL #3   Title  Pt will decrease her ODI score 36% to <31 % in order to return ADLs and households  chores    Time  12    Period  Weeks    Status  New    Target Date  07/06/17      PT LONG TERM GOAL #4   Title  Pt will demo proper alignment and body mechanics with lifting wood, grandbaby, and exercises in order to minimize pain and risk for injuries    Time  8    Period  Weeks    Status  New    Target Date  06/08/17      PT LONG TERM GOAL #5   Title  Pt will demo decreased abdominal scar restrictions in order to regain hip/ trunk mobility to turn body to drive    Time  4    Period  Weeks    Status  New    Target Date  05/11/17      Additional Long Term Goals   Additional Long Term Goals  Yes      PT LONG TERM GOAL #6   Title  Pt will be IND with scoliosis HEP and compliant with wearing shoe lift      Time  10    Period  Weeks    Status  New    Target Date  06/22/17      PT LONG TERM GOAL #7   Title  Pt will demo self-correction with perturbation training and increased ability to step over objects and pull 80 lbs on cable column to simulate pulling dog and leash in order to minimize risks of falls. ,     Time  6812    Period  Weeks    Status  New    Target Date  07/06/17             Plan - 04/13/17 0925    Clinical Impression Statement   Pt is a 56 yo female with report of pains at her neck, shoulder, hips in addition to frequent urination,  difficulty sleeping, and frequent falls.  These deficits limit her ADLs and puts her at risk for falls.  Pt 's clinical presentations include height discrepancies at iliac crest and thorax between R and L sides , increased thoracic kyphosis, increased abdominal scar restrictions, limited cervical ROM, hip weakness, limited sacroiliac joint mobility, and poor body mechanics which places strain on her spine and puts her at risk for compression Fx 2/2 osteoporosis. Following Tx today, pt demo'd improved SIJ mobility and demo'd proper body mechanics with bending and getting out of bed. Shoe lift was provided for R shoe to correct leg  length difference. Pt reported less tenderness at R PSIS and hip post Tx.  PT communicated w/ the office of her cardiologist re: her increased risks for OSA (i.e. cardiac issues, snoring, and nocturia) and recommeneded that pt be screened for sleep study. Staff recorded message and voiced she will relay message to MD.         History and Personal Factors relevant to plan of care:  auto-immune disease Sjorgen's , cardiac issues ( PVC/ tachycardia), fibromyalgia, neuropathy in B feet , osteoporosis     Clinical Presentation  Evolving    Clinical Decision  Making  High    Rehab Potential  Good    PT Frequency  1x / week    PT Duration  12 weeks    PT Treatment/Interventions  Moist Heat;Traction;Functional mobility training;Stair training;Patient/family education;Therapeutic activities;Therapeutic exercise;Balance training;Scar mobilization;Manual techniques;Neuromuscular re-education;Gait training    Consulted and Agree with Plan of Care  Patient       Patient will benefit from skilled therapeutic intervention in order to improve the following deficits and impairments:  Abnormal gait, Impaired UE functional use, Decreased safety awareness, Decreased endurance, Decreased activity tolerance, Decreased knowledge of precautions, Impaired vision/preception, Pain, Decreased scar mobility, Decreased balance, Decreased mobility, Impaired sensation, Decreased strength, Improper body mechanics, Hypomobility, Increased fascial restricitons  Visit Diagnosis: Other abnormalities of gait and mobility  Unequal leg length  Muscle weakness (generalized)  Repeated falls  G-Codes - April 23, 2017 1129    Functional Assessment Tool Used (Outpatient Only)  NDI 54%, ODI 36%, clincal judgement     Functional Limitation  Mobility: Walking and moving around    Mobility: Walking and Moving Around Current Status [phone])  At least 40 percent but less than 60 percent impaired, limited or restricted    Mobility: Walking and  Moving Around Goal Status [phone])  At least 1 percent but less than 20 percent impaired, limited or restricted        Problem List Patient Active Problem List   Diagnosis Date Noted  . Fibromyalgia 12/18/2016  . GERD (gastroesophageal reflux disease) 12/18/2016  . IBS (irritable bowel syndrome) 12/18/2016  . Gastroenteritis 07/03/2016  . Primary Sjogren's syndrome (HCC) 04/26/2011    Traci Lester ,PT, DPT, E-RYT  04/23/17, 11:30 AM  [facility] MAIN [facility] SERVICES [address] [place], [place], [address] Phone: [phone]   Fax:  [phone]  Name: Traci Lester MRN: [id number] Date of Birth: 30-May-1961

## 2017-04-18 ENCOUNTER — Ambulatory Visit: Payer: 59 | Admitting: Physical Therapy

## 2017-04-18 DIAGNOSIS — M6281 Muscle weakness (generalized): Secondary | ICD-10-CM

## 2017-04-18 DIAGNOSIS — R2689 Other abnormalities of gait and mobility: Secondary | ICD-10-CM | POA: Diagnosis not present

## 2017-04-18 DIAGNOSIS — R296 Repeated falls: Secondary | ICD-10-CM

## 2017-04-18 DIAGNOSIS — M2170 Unequal limb length (acquired), unspecified site: Secondary | ICD-10-CM

## 2017-04-18 NOTE — Patient Instructions (Addendum)
Open book ( handout)  To release midback tightness      __________________ band under heels while laying on back w/ knees bent  Reclined position to accommodate for cardiac conditions  "W" exercise  10 reps x 2 sets    Band is placed under feet, knees bent, feet are hip width apart Hold band with thumbs point out, keep upper arm and elbow touching the bed the whole time  - inhale and then exhale pull bands by bending elbows hands move in a "w"  (feel shoulder blades squeezing)    _____________________   Traci Lester Shell 45 Degrees propped on elbows    Lying with hips and knees bent 45, one pillow between knees and ankles. Lift knee with exhale. Be sure pelvis does not roll backward. Do not arch back. Do 10 times, each leg, 2 times per day.

## 2017-04-18 NOTE — Therapy (Signed)
[facility] MAIN [facility] SERVICES [address] [place], [place], [address] Phone: [phone]   Fax:  [phone]  Physical Therapy Treatment  Patient Details  Name: ANNITA RATLIFF MRN: [id number] Date of Birth: [birth year] Referring Provider: Dr. Renard Matter    Encounter Date: 04/18/2017  PT End of Session - 04/18/17 1414    Visit Number  2    Number of Visits  12    Date for PT Re-Evaluation  07/06/17    PT Start Time  1305    PT Stop Time  1400    PT Time Calculation (min)  55 min    Activity Tolerance  Patient tolerated treatment well       Past Medical History:  Diagnosis Date  . Allergy   . Fibromyalgia   . IBS (irritable bowel syndrome)   . PVC (premature ventricular contraction)   . Sjogren's syndrome [facility])     Past Surgical History:  Procedure Laterality Date  . ABDOMINAL HYSTERECTOMY    . CESAREAN SECTION     times 3  . KNEE SURGERY      There were no vitals filed for this visit.  Subjective Assessment - 04/18/17 1310    Subjective  Pt reported she felt pain in her R hips after last session. It calmed after a day. Pt continues to wear her shoe lift. Pt would like to learn how to hold her granddtr while walking.  1-2/10 pain level on her R back    Pertinent History  auto-immune disease Sjorgen's , cardiac issues ( PVC/ tachycardia), fibromyalgia, neuropathy in B feet, osteoporosis     Patient Stated Goals  to be able to shave underarms and to gaze out car window without pain          [facility] PT Assessment - 04/18/17 1317      Palpation   Spinal mobility  Increased inter/paraspinal mm tensions and tendernss along thoracic region, medial to B scapula , B levator scapulae ( decreased Post Tx)  R 12th anterior rib lower than L, in hooklying     SI assessment   R PSIS with tenderness, limitation into hip ext in stance  supine R ASIS more posterior                   [facility] Adult PT Treatment/Exercise - 04/18/17  1353      Exercises   Exercises  -- see pt instructions       Moist Heat Therapy   Number Minutes Moist Heat  10 Minutes    Moist Heat Location  Cervical;Other (comment) back       Manual Therapy   Manual therapy comments  AP mob at L hip FADDIR/ ER mob Grade II,  STM along problem areas at thoraicc regiona described in assessment              PT Education - 04/18/17 1355    Education provided  Yes    Education Details  HEP    Person(s) Educated  Patient    Methods  Explanation;Demonstration;Tactile cues;Verbal cues;Handout    Comprehension  Verbalized understanding;Returned demonstration          PT Long Term Goals - 04/13/17 1038      PT LONG TERM GOAL #1   Title  Pt will show less forward head posture/ thoracic kyphosis with increased distance between earlobe to acromium 18.5 cm R, 17.5 cm L to < 17 cm B in order to minimize  risk for compression Fx with osteoporosis and to improve postural stability to m inimize neck , shoulder, and hip pain to sleep    Time  12    Period  Weeks    Status  New    Target Date  07/06/17      PT LONG TERM GOAL #2   Title  Pt will decrease her NDI score from 54% to <49 % in order to shave underarms and turn head when driving    Time  12    Period  Weeks    Status  New    Target Date  07/06/17      PT LONG TERM GOAL #3   Title  Pt will decrease her ODI score 36% to <31 % in order to return ADLs and households chores    Time  12    Period  Weeks    Status  New    Target Date  07/06/17      PT LONG TERM GOAL #4   Title  Pt will demo proper alignment and body mechanics with lifting wood, grandbaby, and exercises in order to minimize pain and risk for injuries    Time  8    Period  Weeks    Status  New    Target Date  06/08/17      PT LONG TERM GOAL #5   Title  Pt will demo decreased abdominal scar restrictions in order to regain hip/ trunk mobility to turn body to drive    Time  4    Period  Weeks    Status  New    Target  Date  05/11/17      Additional Long Term Goals   Additional Long Term Goals  Yes      PT LONG TERM GOAL #6   Title  Pt will be IND with scoliosis HEP and compliant with wearing shoe lift      Time  10    Period  Weeks    Status  New    Target Date  06/22/17      PT LONG TERM GOAL #7   Title  Pt will demo self-correction with perturbation training and increased ability to step over objects and pull 80 lbs on cable column to simulate pulling dog and leash in order to minimize risks of falls. ,     Time  12    Period  Weeks    Status  New    Target Date  07/06/17            Plan - 04/18/17 1420    Clinical Impression Statement  Pt demo'd a less rotated pelvis and less mm tensions along thoracic region/ shoulders following Tx today. Pt showed a more upright posture with gait. Advanced pt to thoracolumbar strengthening.Pt continues to benefit from skilled PT    Rehab Potential  Good    PT Frequency  1x / week    PT Duration  12 weeks    PT Treatment/Interventions  Moist Heat;Traction;Functional mobility training;Stair training;Patient/family education;Therapeutic activities;Therapeutic exercise;Balance training;Scar mobilization;Manual techniques;Neuromuscular re-education;Gait training    Consulted and Agree with Plan of Care  Patient       Patient will benefit from skilled therapeutic intervention in order to improve the following deficits and impairments:  Abnormal gait, Impaired UE functional use, Decreased safety awareness, Decreased endurance, Decreased activity tolerance, Decreased knowledge of precautions, Impaired vision/preception, Pain, Decreased scar mobility, Decreased balance, Decreased mobility, Impaired sensation, Decreased strength, Improper  body mechanics, Hypomobility, Increased fascial restricitons  Visit Diagnosis: Other abnormalities of gait and mobility  Unequal leg length  Muscle weakness (generalized)  Repeated falls     Problem List Patient  Active Problem List   Diagnosis Date Noted  . Fibromyalgia 12/18/2016  . GERD (gastroesophageal reflux disease) 12/18/2016  . IBS (irritable bowel syndrome) 12/18/2016  . Gastroenteritis 07/03/2016  . Primary Sjogren's syndrome (HCC) 04/26/2011    Mariane MastersYeung,Shin Yiing ,PT, DPT, E-RYT  04/18/2017, 2:49 PM  [place] [facility] [address] [place], [place], [address] Phone: [phone]   Fax:  [phone]  Name: Criss AlvineKatherine V Dearden MRN: [id number] Date of Birth: April 22, 1961

## 2017-04-24 ENCOUNTER — Ambulatory Visit: Payer: 59 | Admitting: Physical Therapy

## 2017-05-09 ENCOUNTER — Ambulatory Visit: Payer: 59 | Attending: Internal Medicine | Admitting: Physical Therapy

## 2017-05-09 DIAGNOSIS — M6281 Muscle weakness (generalized): Secondary | ICD-10-CM | POA: Diagnosis present

## 2017-05-09 DIAGNOSIS — R2689 Other abnormalities of gait and mobility: Secondary | ICD-10-CM | POA: Insufficient documentation

## 2017-05-09 DIAGNOSIS — R296 Repeated falls: Secondary | ICD-10-CM | POA: Insufficient documentation

## 2017-05-09 DIAGNOSIS — M2170 Unequal limb length (acquired), unspecified site: Secondary | ICD-10-CM | POA: Insufficient documentation

## 2017-05-09 NOTE — Therapy (Signed)
[facility] MAIN [facility] [address] [place], [place], [address] Phone: [phone]   Fax:  [phone]  Physical Therapy Treatment  Patient Details  Name: Traci AlvineKatherine V Lester MRN: [id number] Date of Birth: 01-Mar-1961 Referring Provider: Dr. Renard MatterBock    Encounter Date: 05/09/2017  PT End of Session - 05/09/17 1401    Visit Number  3    Number of Visits  12    Date for PT Re-Evaluation  07/06/17    PT Start Time  1304    PT Stop Time  1402    PT Time Calculation (min)  58 min    Activity Tolerance  Patient tolerated treatment well       Past Medical History:  Diagnosis Date  . Allergy   . Fibromyalgia   . IBS (irritable bowel syndrome)   . PVC (premature ventricular contraction)   . Sjogren's syndrome [facility](HCC)     Past Surgical History:  Procedure Laterality Date  . ABDOMINAL HYSTERECTOMY    . CESAREAN SECTION     times 3  . COLONOSCOPY WITH PROPOFOL N/A 01/15/2017   Procedure: COLONOSCOPY WITH PROPOFOL;  Surgeon: Christena DeemSkulskie, Martin U, MD;  [facility];  Service: Endoscopy;  Laterality: N/A;  . KNEE SURGERY      There were no vitals filed for this visit.  Subjective Assessment - 05/09/17 1305    Subjective  Pt reported she was able to drive and look over her shoulders. Her neck is still stiff a bit. Pt had increased pain throughout out spine after a 6 hour car ride and sleeping on the ground when visitng family. Pt applied icey hot patches which helped somewhat. She was taking Tylenol and ADvil but they were not helpful. Pt's pain decreased from 8/10 to 4/10.     Pertinent History  auto-immune disease Sjorgen's , cardiac issues ( PVC/ tachycardia), fibromyalgia, neuropathy in B feet, osteoporosis     Patient Stated Goals  to be able to shave underarms and to gaze out car window without pain          [facility] PT Assessment - 05/09/17 1357      Palpation   Spinal mobility  tenderness/ hypomobility at C5-6 , increased  tensions at posterior scalenes. hypomobility at 1st rib L > R , interspinal mm tensions along lower cervical         Gait assessment: heel striking, limited cervical and thoracic rotation, poor arm swings. Improved post Tx           [facility] - 05/09/17 1358      Exercises   Exercises  -- see pt instructions       Manual Therapy   Manual therapy comments  inferior mob of scapular, 1st rib, MWM with cervical ext/ sidebend L, STM along interspinals/ scalenes                   PT Long Term Goals - 05/09/17 1403      PT LONG TERM GOAL #1   Title  Pt will show less forward head posture/ thoracic kyphosis with increased distance between earlobe to acromium 18.5 cm R, 17.5 cm L to < 17 cm B in order to minimize risk for compression Fx with osteoporosis and to improve postural stability to m inimize neck , shoulder, and hip pain to sleep    Time  12    Period  Weeks    Status  On-going  PT LONG TERM GOAL #2   Title  Pt will decrease her NDI score from 54% to <49 % in order to shave underarms and turn head when driving    Time  12    Period  Weeks    Status  On-going      PT LONG TERM GOAL #3   Title  Pt will decrease her ODI score 36% to <31 % in order to return ADLs and households chores    Time  12    Period  Weeks    Status  On-going      PT LONG TERM GOAL #4   Title  Pt will demo proper alignment and body mechanics with lifting wood, grandbaby, and exercises in order to minimize pain and risk for injuries    Time  8    Period  Weeks    Status  On-going      PT LONG TERM GOAL #5   Title  Pt will demo decreased abdominal scar restrictions in order to regain hip/ trunk mobility to turn body to drive    Time  4    Period  Weeks    Status  On-going      PT LONG TERM GOAL #6   Title  Pt will be IND with scoliosis HEP and compliant with wearing shoe lift      Time  10    Period  Weeks    Status  On-going      PT LONG TERM GOAL  #7   Title  Pt will demo self-correction with perturbation training and increased ability to step over objects and pull 80 lbs on cable column to simulate pulling dog and leash in order to minimize risks of falls. ,     Time  12    Period  Weeks    Status  On-going            Plan - 05/09/17 1402    Clinical Impression Statement  Pt showed increased sidebend L and more thoracic extension post Tx. Pt also learned to initate head movements with thoracic extension prior to cervical movements.  Following manual Tx to L posterior scalenes and cervical mm followed by relaxation technique, pt reported she feels "her chest and shoulders more relaxed and open" and "can hold herself up better." Pt also demo'd improved gait with cuing for increased trunk rotation and more weightbearing in midfoot instead of heels.  Anticipate pt 's balance will improve as she restores her cervical and thoracic mobility.  Pt tolerated today's Tx without complaints. Pt continues to benefits from skilled PT.   Pt plans to f/u w/ her PCP re: sleep study recommendation  because pt's cardiac workup results did not show any significant re: her tachycardia episodes. P was explained about the association with cardiac conditions with OSA. Pt voiced understanding.      Rehab Potential  Good    PT Frequency  1x / week    PT Duration  12 weeks    PT Treatment/Interventions  Moist Heat;Traction;Functional mobility training;Stair training;Patient/family education;Therapeutic activities;Therapeutic exercise;Balance training;Scar mobilization;Manual techniques;Neuromuscular re-education;Gait training    Consulted and Agree with Plan of Care  Patient       Patient will benefit from skilled therapeutic intervention in order to improve the following deficits and impairments:  Abnormal gait, Impaired UE functional use, Decreased safety awareness, Decreased endurance, Decreased activity tolerance, Decreased knowledge of precautions,  Impaired vision/preception, Pain, Decreased scar mobility, Decreased balance, Decreased mobility, Impaired  sensation, Decreased strength, Improper body mechanics, Hypomobility, Increased fascial restricitons  Visit Diagnosis: Other abnormalities of gait and mobility  Unequal leg length  Muscle weakness (generalized)  Repeated falls     Problem List Patient Active Problem List   Diagnosis Date Noted  . Fibromyalgia 12/18/2016  . GERD (gastroesophageal reflux disease) 12/18/2016  . IBS (irritable bowel syndrome) 12/18/2016  . Gastroenteritis 07/03/2016  . Primary Sjogren's syndrome (HCC) 04/26/2011    Mariane MastersYeung,Shin Yiing ,PT, DPT, E-RYT  05/09/2017, 2:05 PM  [facility] [address] [place], [place], [address] Phone: [phone]   Fax:  [phone]  Name: Traci AlvineKatherine V Lester MRN: [id number] Date of Birth: 19-Feb-1961

## 2017-05-09 NOTE — Patient Instructions (Signed)
6 directions of neck without pillow under head 3-5 reps   Chin up/ down Turn to R/ L  sidebend ear to shoulder with pulling down of opposite fingers and arms    _________   Shoulder squeezes down and together while head presses ( 5 sec count aloud, 10 reps)    _________ To help with sleeping  Body scan  ( emailed ) for relaxation With  Folded handtowel, pointed corner towards spine under the arch of R upper back     ___________

## 2017-05-23 ENCOUNTER — Ambulatory Visit: Payer: 59 | Admitting: Physical Therapy

## 2017-05-23 DIAGNOSIS — R2689 Other abnormalities of gait and mobility: Secondary | ICD-10-CM

## 2017-05-23 DIAGNOSIS — M2170 Unequal limb length (acquired), unspecified site: Secondary | ICD-10-CM

## 2017-05-23 DIAGNOSIS — R296 Repeated falls: Secondary | ICD-10-CM

## 2017-05-23 DIAGNOSIS — M6281 Muscle weakness (generalized): Secondary | ICD-10-CM

## 2017-05-23 NOTE — Patient Instructions (Signed)
Restorative pose  Towel folded into thirds long ways  Neck roll under   Towel horizontal for heels and pillow knees   15 mins   _________  Principles of deep relaxation: Pressure: rice bags or shoulders or eyepillow  Darkness: cover the eyes with eye pillow  Warmth cover with blanket   ___________  [email or web address]

## 2017-05-23 NOTE — Therapy (Signed)
[facility] MAIN [facility] SERVICES [address] [place], [place], [address] Phone: [phone]   Fax:  [phone]  Physical Therapy Treatment  Patient Details  Name: Traci Lester MRN: [id number] Date of Birth: [birth year] Referring Provider: Dr. Renard Matter    Encounter Date: 05/23/2017  PT End of Session - 05/23/17 1417    Visit Number  4    Number of Visits  12    Date for PT Re-Evaluation  07/06/17    PT Start Time  1305    PT Stop Time  1405    PT Time Calculation (min)  60 min    Activity Tolerance  Patient tolerated treatment well       Past Medical History:  Diagnosis Date  . Allergy   . Fibromyalgia   . IBS (irritable bowel syndrome)   . PVC (premature ventricular contraction)   . Sjogren's syndrome [facility])     Past Surgical History:  Procedure Laterality Date  . ABDOMINAL HYSTERECTOMY    . CESAREAN SECTION     times 3  . COLONOSCOPY WITH PROPOFOL N/A 01/15/2017   Procedure: COLONOSCOPY WITH PROPOFOL;  Surgeon: Christena Deem, MD;  Location: [facility] ENDOSCOPY;  Service: Endoscopy;  Laterality: N/A;  . KNEE SURGERY      There were no vitals filed for this visit.  Subjective Assessment - 05/23/17 1307    Subjective  pt reports her lower back and hips are bothering her today. She notices she feels fine after leaving the PT sessions but then that night and the next day, she experiences lots of pain the along the spine. This pain lingers and she has been taking Tynenol and Advil without relief. Pt is sleeping better especially the first half of the night. Pt is not waking up stiff anymore but has a headache which is relieved by coffee.     Pertinent History  auto-immune disease Sjorgen's , cardiac issues ( PVC/ tachycardia), fibromyalgia, neuropathy in B feet, osteoporosis     Patient Stated Goals  to be able to shave underarms and to gaze out car window without pain          [facility] PT Assessment - 05/23/17 1342      Palpation   Spinal mobility  tightness, guarding along T 7-10 L     SI assessment   pelvic alignment symmetrical                   [facility] Adult PT Treatment/Exercise - 05/23/17 0001      Neuro Re-ed    Neuro Re-ed Details   see pt instructions  ( no charge during 8 min of restorative pose while pt was resting)      Manual Therapy   Manual therapy comments  STM at L paraspinal T7-10                   PT Long Term Goals - 05/09/17 1403      PT LONG TERM GOAL #1   Title  Pt will show less forward head posture/ thoracic kyphosis with increased distance between earlobe to acromium 18.5 cm R, 17.5 cm L to < 17 cm B in order to minimize risk for compression Fx with osteoporosis and to improve postural stability to m inimize neck , shoulder, and hip pain to sleep    Time  12    Period  Weeks    Status  On-going      PT LONG TERM  GOAL #2   Title  Pt will decrease her NDI score from 54% to <49 % in order to shave underarms and turn head when driving    Time  12    Period  Weeks    Status  On-going      PT LONG TERM GOAL #3   Title  Pt will decrease her ODI score 36% to <31 % in order to return ADLs and households chores    Time  12    Period  Weeks    Status  On-going      PT LONG TERM GOAL #4   Title  Pt will demo proper alignment and body mechanics with lifting wood, grandbaby, and exercises in order to minimize pain and risk for injuries    Time  8    Period  Weeks    Status  On-going      PT LONG TERM GOAL #5   Title  Pt will demo decreased abdominal scar restrictions in order to regain hip/ trunk mobility to turn body to drive    Time  4    Period  Weeks    Status  On-going      PT LONG TERM GOAL #6   Title  Pt will be IND with scoliosis HEP and compliant with wearing shoe lift      Time  10    Period  Weeks    Status  On-going      PT LONG TERM GOAL #7   Title  Pt will demo self-correction with perturbation training and increased ability to step  over objects and pull 80 lbs on cable column to simulate pulling dog and leash in order to minimize risks of falls. ,     Time  12    Period  Weeks    Status  On-going            Plan - 05/23/17 1417    Clinical Impression Statement  Pt demo'd  equal alignment of pelvis and significantly less forward head postture today. Following Tx, decreased L thoracic mm tensions and less guarding. Pt responded to more gentler manual Tx and responded positively to restortive yoga to help decrease mm holding in the body. Pt was educated on activity pacing and energy conservation principles to manage compromised immune system 2/2 autoimmune disease.  Pt was also educated on principles /research of restorative yoga to help with deep relaxation and boosting of immune system. Pt reported feeling real relaxed and showed signs of parasymphetic activation (i.e. yawning).  Pt was explained the neuroscience of pain. Anticiapte pt will benefit from biopsychosocial approaches to pain management for long-lasting benefits. Pt continues to benefit from skilled PT. Plan to add strengthening exercises at next session.     Rehab Potential  Good    PT Frequency  1x / week    PT Duration  12 weeks    PT Treatment/Interventions  Moist Heat;Traction;Functional mobility training;Stair training;Patient/family education;Therapeutic activities;Therapeutic exercise;Balance training;Scar mobilization;Manual techniques;Neuromuscular re-education;Gait training    Consulted and Agree with Plan of Care  Patient       Patient will benefit from skilled therapeutic intervention in order to improve the following deficits and impairments:  Abnormal gait, Impaired UE functional use, Decreased safety awareness, Decreased endurance, Decreased activity tolerance, Decreased knowledge of precautions, Impaired vision/preception, Pain, Decreased scar mobility, Decreased balance, Decreased mobility, Impaired sensation, Decreased strength, Improper body  mechanics, Hypomobility, Increased fascial restricitons  Visit Diagnosis: Other abnormalities of gait and mobility  Unequal leg length  Muscle weakness (generalized)  Repeated falls     Problem List Patient Active Problem List   Diagnosis Date Noted  . Fibromyalgia 12/18/2016  . GERD (gastroesophageal reflux disease) 12/18/2016  . IBS (irritable bowel syndrome) 12/18/2016  . Gastroenteritis 07/03/2016  . Primary Sjogren's syndrome (HCC) 04/26/2011    Mariane MastersYeung,Shin Yiing ,PT, DPT, E-RYT  05/23/2017, 2:30 PM  [facility] [address] [place], [place], [address] Phone: [phone]   Fax:  [phone]  Name: Traci Lester MRN: [id number] Date of Birth: [birth year]

## 2017-06-07 ENCOUNTER — Ambulatory Visit: Payer: 59 | Admitting: Physical Therapy

## 2017-06-20 ENCOUNTER — Ambulatory Visit: Payer: 59 | Attending: Internal Medicine | Admitting: Physical Therapy

## 2017-06-20 DIAGNOSIS — M791 Myalgia, unspecified site: Secondary | ICD-10-CM | POA: Diagnosis not present

## 2017-06-20 DIAGNOSIS — R2689 Other abnormalities of gait and mobility: Secondary | ICD-10-CM | POA: Insufficient documentation

## 2017-06-20 DIAGNOSIS — M6281 Muscle weakness (generalized): Secondary | ICD-10-CM | POA: Insufficient documentation

## 2017-06-20 DIAGNOSIS — R296 Repeated falls: Secondary | ICD-10-CM | POA: Insufficient documentation

## 2017-06-20 DIAGNOSIS — M2170 Unequal limb length (acquired), unspecified site: Secondary | ICD-10-CM | POA: Diagnosis present

## 2017-06-20 NOTE — Therapy (Signed)
[place] MAIN [facility] SERVICES [address] [place], [place], [address] Phone: [phone]   Fax:  [phone]  Physical Therapy Treatment / Progress Note   Patient Details  Name: Traci Lester MRN: [id number] Date of Birth: 1961-03-23 Referring Provider: Dr. Meda Coffee    Encounter Date: 06/20/2017  PT End of Session - 06/20/17 1314    Visit Number  5    Number of Visits  12    Date for PT Re-Evaluation  07/06/17    PT Start Time  1310    PT Stop Time  1400    PT Time Calculation (min)  50 min    Activity Tolerance  Patient tolerated treatment well       Past Medical History:  Diagnosis Date  . Allergy   . Fibromyalgia   . IBS (irritable bowel syndrome)   . PVC (premature ventricular contraction)   . Sjogren's syndrome [facility])     Past Surgical History:  Procedure Laterality Date  . ABDOMINAL HYSTERECTOMY    . CESAREAN SECTION     times 3  . COLONOSCOPY WITH PROPOFOL N/A 01/15/2017   Procedure: COLONOSCOPY WITH PROPOFOL;  Surgeon: Lollie Sails, MD;  Location: [facility] ENDOSCOPY;  Service: Endoscopy;  Laterality: N/A;  . KNEE SURGERY      There were no vitals filed for this visit.  Subjective Assessment - 06/20/17 1315    Subjective  Pt reports she felt good for 1 week after last session. Pt felt she broke through the pain cycle.  She is feeling anxious about getting sick and hospitalized. Pt started communicating her needs with her family to help keep her home germ-free. Pt is starting to designate places in her home for calm and relaxation.  Pt reports her neck and R hip are both painful.      Pertinent History  auto-immune disease Sjorgen's , cardiac issues ( PVC/ tachycardia), fibromyalgia, neuropathy in B feet, osteoporosis     Patient Stated Goals  to be able to shave underarms and to gaze out car window without pain          [facility] PT Assessment - 06/20/17 1337      Palpation   Spinal mobility  increased upper trap,  scal;enes, levator mm tightness      Palpation comment  ASIS symmetrical, R ib more anterior, L scpular delayed in downward rotation                   [facility] Adult PT Treatment/Exercise - 06/20/17 1402      Neuro Re-ed    Neuro Re-ed Details   see pt instructions       Manual Therapy   Manual therapy comments  Inferior mobs at 1st rib/ spine of scapula to create downward rotation with MWM                    PT Long Term Goals - 06/20/17 1431      PT LONG TERM GOAL #1   Title  Pt will show less forward head posture/ thoracic kyphosis with increased distance between earlobe to acromium 18.5 cm R, 17.5 cm L to < 17 cm B in order to minimize risk for compression Fx with osteoporosis and to improve postural stability to m inimize neck , shoulder, and hip pain to sleep  (16 cm 1/16)     Time  12    Period  Weeks    Status  Achieved  PT LONG TERM GOAL #2   Title  Pt will decrease her NDI score from 54% to <49 % in order to shave underarms and turn head when driving    Time  12    Period  Weeks    Status  On-going      PT LONG TERM GOAL #3   Title  Pt will decrease her ODI score 36% to <31 % in order to return ADLs and households chores    Time  12    Period  Weeks    Status  On-going      PT LONG TERM GOAL #4   Title  Pt will demo proper alignment and body mechanics with lifting wood, grandbaby, and exercises in order to minimize pain and risk for injuries    Time  8    Period  Weeks    Status  Achieved      PT LONG TERM GOAL #5   Title  Pt will demo decreased abdominal scar restrictions in order to regain hip/ trunk mobility to turn body to drive    Time  4    Period  Weeks    Status  Partially Met      PT LONG TERM GOAL #6   Title  Pt will be IND with scoliosis HEP and compliant with wearing shoe lift      Time  10    Period  Weeks    Status  Partially Met      PT LONG TERM GOAL #7   Title  Pt will demo self-correction with perturbation  training and increased ability to step over objects and pull 80 lbs on cable column to simulate pulling dog and leash in order to minimize risks of falls. ,     Time  12    Period  Weeks    Status  On-going            Plan - 06/20/17 1431    Clinical Impression Statement  Pt has achieved 2/6 and is progressing well towards her remaining goals. Pt demo's signficantly improved posture with less forward head, decreased back mm tightness, and more symmetrically aligned pelvic girdle. Pt has achieved 2/6 and is progressing well towards her remaining goals. Pt demo'd signficantly improved posture  with less forward head, decreased back mm tightness, and more symmetrically aligned pelvic girdle. Pt is responding well to scoliosis-specific manual Tx, biopsychosocial approaches, and HEP. Following last session, pt reported she felt good for one week and that she "broke through her pain cycle" . Today, py reported her neck pain decreased by 50%.  Pt will benefit from skilled PT 2x/  week in order to accelerate her progress.    Pt will benefit from skilled PT 2x/ week in order to accelerate her progress.      Rehab Potential  Good    PT Frequency  1x / week    PT Duration  12 weeks    PT Treatment/Interventions  Moist Heat;Traction;Functional mobility training;Stair training;Patient/family education;Therapeutic activities;Therapeutic exercise;Balance training;Scar mobilization;Manual techniques;Neuromuscular re-education;Gait training    Consulted and Agree with Plan of Care  Patient       Patient will benefit from skilled therapeutic intervention in order to improve the following deficits and impairments:  Abnormal gait, Impaired UE functional use, Decreased safety awareness, Decreased endurance, Decreased activity tolerance, Decreased knowledge of precautions, Impaired vision/preception, Pain, Decreased scar mobility, Decreased balance, Decreased mobility, Impaired sensation, Decreased strength,  Improper body mechanics, Hypomobility, Increased fascial  restricitons  Visit Diagnosis: Other abnormalities of gait and mobility  Unequal leg length  Muscle weakness (generalized)  Repeated falls     Problem List Patient Active Problem List   Diagnosis Date Noted  . Fibromyalgia 12/18/2016  . GERD (gastroesophageal reflux disease) 12/18/2016  . IBS (irritable bowel syndrome) 12/18/2016  . Gastroenteritis 07/03/2016  . Primary Sjogren's syndrome ([place]) 04/26/2011    Jerl Mina ,PT, DPT, E-RYT   06/20/2017, 2:34 PM  [place] MAIN [facility] SERVICES [address] [place], [place], [address] Phone: [phone]   Fax:  [phone]  Name: STEFAN KAREN MRN: [id number] Date of Birth: June 05, 1961

## 2017-06-20 NOTE — Patient Instructions (Signed)
Angel wing movements  on L with L ear towards shoudlers, elbow bent 10reps to allow for more sliding and rotation of shoulder blade   Progress to deep core with foot slide out and back 6 inches on exhale instead of knee out and in

## 2017-06-22 ENCOUNTER — Ambulatory Visit: Payer: 59 | Admitting: Physical Therapy

## 2017-06-22 DIAGNOSIS — M2170 Unequal limb length (acquired), unspecified site: Secondary | ICD-10-CM

## 2017-06-22 DIAGNOSIS — R2689 Other abnormalities of gait and mobility: Secondary | ICD-10-CM

## 2017-06-22 DIAGNOSIS — R296 Repeated falls: Secondary | ICD-10-CM

## 2017-06-22 DIAGNOSIS — M6281 Muscle weakness (generalized): Secondary | ICD-10-CM

## 2017-06-22 NOTE — Therapy (Signed)
[place] MAIN [facility] SERVICES [address] [place], [place], [address] Phone: [phone]   Fax:  [phone]  Physical Therapy Treatment  Patient Details  Name: Traci Lester MRN: [id number] Date of Birth: [birth year] Referring Provider: Dr. Meda Coffee    Encounter Date: 06/22/2017  PT End of Session - 06/22/17 0944    Visit Number  6    Number of Visits  12    Date for PT Re-Evaluation  09/12/17    PT Start Time  0906    PT Stop Time  0945    PT Time Calculation (min)  39 min    Activity Tolerance  Patient tolerated treatment well       Past Medical History:  Diagnosis Date  . Allergy   . Fibromyalgia   . IBS (irritable bowel syndrome)   . PVC (premature ventricular contraction)   . Sjogren's syndrome [facility])     Past Surgical History:  Procedure Laterality Date  . ABDOMINAL HYSTERECTOMY    . CESAREAN SECTION     times 3  . COLONOSCOPY WITH PROPOFOL N/A 01/15/2017   Procedure: COLONOSCOPY WITH PROPOFOL;  Surgeon: Lollie Sails, MD;  Location: [facility] ENDOSCOPY;  Service: Endoscopy;  Laterality: N/A;  . KNEE SURGERY      There were no vitals filed for this visit.  Subjective Assessment - 06/22/17 0909    Subjective  Pt reported she felt real sore in her midback after last session     Pertinent History  auto-immune disease Sjorgen's , cardiac issues ( PVC/ tachycardia), fibromyalgia, neuropathy in B feet, osteoporosis     Patient Stated Goals  to be able to shave underarms and to gaze out car window without pain          [facility] PT Assessment - 06/22/17 0925      Coordination   Gross Motor Movements are Fluid and Coordinated  -- scaular downward rotation B and corodinated      Posture/Postural Control   Posture Comments  earlobe to acromium 18 cm B  , less forward head , less thoracic kyphosis        Palpation   Spinal mobility   no increased upper trap, scal;enes, levator mm tightness                     [facility] Adult PT Treatment/Exercise - 06/22/17 9449      Neuro Re-ed    Neuro Re-ed Details   see pt instructions.  Modified L lean with band with report of achiness of muscles, after modification to promote less upper trap overuse, pt had no report of achiness                    PT Long Term Goals - 06/20/17 1431      PT LONG TERM GOAL #1   Title  Pt will show less forward head posture/ thoracic kyphosis with increased distance between earlobe to acromium 18.5 cm R, 17.5 cm L to < 17 cm B in order to minimize risk for compression Fx with osteoporosis and to improve postural stability to m inimize neck , shoulder, and hip pain to sleep  (16 cm 1/16)     Time  12    Period  Weeks    Status  Achieved      PT LONG TERM GOAL #2   Title  Pt will decrease her NDI score from 54% to <49 % in order to  shave underarms and turn head when driving    Time  12    Period  Weeks    Status  On-going      PT LONG TERM GOAL #3   Title  Pt will decrease her ODI score 36% to <31 % in order to return ADLs and households chores    Time  12    Period  Weeks    Status  On-going      PT LONG TERM GOAL #4   Title  Pt will demo proper alignment and body mechanics with lifting wood, grandbaby, and exercises in order to minimize pain and risk for injuries    Time  8    Period  Weeks    Status  Achieved      PT LONG TERM GOAL #5   Title  Pt will demo decreased abdominal scar restrictions in order to regain hip/ trunk mobility to turn body to drive    Time  4    Period  Weeks    Status  Partially Met      PT LONG TERM GOAL #6   Title  Pt will be IND with scoliosis HEP and compliant with wearing shoe lift      Time  10    Period  Weeks    Status  Partially Met      PT LONG TERM GOAL #7   Title  Pt will demo self-correction with perturbation training and increased ability to step over objects and pull 80 lbs on cable column to simulate pulling dog and leash in order to  minimize risks of falls. ,     Time  12    Period  Weeks    Status  On-going            Plan - 06/22/17 0945    Clinical Impression Statement Pt showed significantly decreased L mm tightness medial to scapula compared to previous session. Pt felt sore after last session but has no complaints today ( 2 days following last session).  Pt came 2x this week for one time only in order to ensure the manual Tx did not cause any flare-ups as pt is sensitive to manual Tx given her co-morbidities with fibromyalgia and Sjorgen's condition.   Pt showed signficantly less forward head and thoracic kyphosis and no scapular dyskinesis. Initiated R flank lengthening and L flank shortening with resistance band to minimize worsening of scoliosis. Exercise was modified to minimzie overuse of upper trap mm. Progressed hip abduction strengthening exercises. Pt continues to benefit from skilled PT.     Rehab Potential  Good    PT Frequency  1x / week    PT Duration  12 weeks    PT Treatment/Interventions  Moist Heat;Traction;Functional mobility training;Stair training;Patient/family education;Therapeutic activities;Therapeutic exercise;Balance training;Scar mobilization;Manual techniques;Neuromuscular re-education;Gait training    Consulted and Agree with Plan of Care  Patient       Patient will benefit from skilled therapeutic intervention in order to improve the following deficits and impairments:  Abnormal gait, Impaired UE functional use, Decreased safety awareness, Decreased endurance, Decreased activity tolerance, Decreased knowledge of precautions, Impaired vision/preception, Pain, Decreased scar mobility, Decreased balance, Decreased mobility, Impaired sensation, Decreased strength, Improper body mechanics, Hypomobility, Increased fascial restricitons  Visit Diagnosis: Other abnormalities of gait and mobility  Unequal leg length  Muscle weakness (generalized)  Repeated falls     Problem  List Patient Active Problem List   Diagnosis Date Noted  . Fibromyalgia 12/18/2016  .  GERD (gastroesophageal reflux disease) 12/18/2016  . IBS (irritable bowel syndrome) 12/18/2016  . Gastroenteritis 07/03/2016  . Primary Sjogren's syndrome ([place]) 04/26/2011    Jerl Mina ,PT, DPT, E-RYT  06/22/2017, 10:00 AM  [place] MAIN [facility] SERVICES [address] [place], [place], [address] Phone: [phone]   Fax:  [phone]  Name: Traci Lester MRN: [id number] Date of Birth: 01/25/1961

## 2017-06-22 NOTE — Patient Instructions (Addendum)
Strengthening   Progress clam shells with red band on forearm,  Elbow slightly ahead of shoulder socket, press forearm down ,  Lining up elbow with hip and heels    10 x 2   ___________   Deep core level 2 ( knee out) with red band  , press into feet 6 min    _________    Claudie Fishermanhin tuck, armpit presses with elbows bent ,  Red band at thighs  , press into feet, knees lined with hips . Make sure there is no arch in your back  5 sec x 10 reps, count       __________    Wall lean for scoliosis   Forearm slide against wall as you stand perpendicular to wall, band under feet, feet hip width apart,  Opposite elbow by side, , imagine holding pencil under armpit as you lean toward wall, lowering forearm against the wall and opposite hand pulls band without letting elbow move away from side body  And slide back up , straightening body    Make sure the upper trapezius muscle does not hike up to ear as band is being pulled as you lean towards wall   Yellow band  10 x 2    Leaning L only to open the R flank area

## 2017-06-27 ENCOUNTER — Ambulatory Visit: Payer: 59 | Admitting: Physical Therapy

## 2017-07-04 ENCOUNTER — Ambulatory Visit: Payer: 59 | Admitting: Physical Therapy

## 2017-07-11 ENCOUNTER — Ambulatory Visit: Payer: 59 | Attending: Internal Medicine | Admitting: Physical Therapy

## 2017-07-11 DIAGNOSIS — R2689 Other abnormalities of gait and mobility: Secondary | ICD-10-CM

## 2017-07-11 DIAGNOSIS — M2170 Unequal limb length (acquired), unspecified site: Secondary | ICD-10-CM | POA: Diagnosis present

## 2017-07-11 DIAGNOSIS — M6281 Muscle weakness (generalized): Secondary | ICD-10-CM

## 2017-07-11 DIAGNOSIS — R296 Repeated falls: Secondary | ICD-10-CM

## 2017-07-11 NOTE — Patient Instructions (Addendum)
Wall squats 30 reps  Think about ribs down with breathing to avoid arched back    ________   Yoga stretch for R flank   Downward facing dog with belt at waist and hands on chair  Then stretch sides: palm on palm And then L hand on R thigh, keep R hand on chair   look under R armpit  trunk rotation    ___________  Pulling the wagon with granddaughter: Rope to lengthen the handle, pull with the hands shoudler width apart without overaching the low back     _____________  [email or web address]   [email or web address]     _______  [employer]"Mountain hiker throwing"  - band   Lunge position, tes point forward, trunk leans to create diagonal line with back leg)  Ski track stance, band under back foot,  50% weight on front leg (knee above ankle)  50% weight on back leg   Band are against buttocks and band Elbows point to the sky, hands holding band   Inhale, exhale extend elbows like you are throwing something overhead,  10 reps on each leg  X 2 sets    Minisquat +  bicep curls  Minisquat: Scoot buttocks back slight, hinge like you are looking at your reflection on a pond  Knees behind toes,   Elbows extended by keep by ribs, armpit muscles hugged in (keep elbows throughout exercise)   Inhale to "smell flowers"  Exhale on the rise "like rocket"   And perform bicepc curl ( bend elbow)  Do not lock knees, have more weight across ballmounds of feet, toes relaxed    "Walk away pulling" with two bands over door   ski track stance, toes point forward Hand bands by hips, squeezing armpits and shoulder blades down and back  Slow forward walking, landing on midfoot  X 3-4 steps  Slow backward walking, ballmound down, then slow lowering of heels  Center of mass is slightly leaned forward

## 2017-07-11 NOTE — Therapy (Signed)
[place] MAIN [facility] SERVICES [address] [place], [place], [address] Phone: [phone]   Fax:  [phone]  Physical Therapy Treatment  Patient Details  Name: Traci Lester MRN: [id number] Date of Birth: [birth year] Referring Provider: Dr. Meda Coffee    Encounter Date: 07/11/2017  PT End of Session - 07/11/17 1706    Visit Number  7    Number of Visits  12    Date for PT Re-Evaluation  09/12/17    PT Start Time  1300    PT Stop Time  3267    PT Time Calculation (min)  55 min    Activity Tolerance  Patient tolerated treatment well       Past Medical History:  Diagnosis Date  . Allergy   . Fibromyalgia   . IBS (irritable bowel syndrome)   . PVC (premature ventricular contraction)   . Sjogren's syndrome [facility])     Past Surgical History:  Procedure Laterality Date  . ABDOMINAL HYSTERECTOMY    . CESAREAN SECTION     times 3  . COLONOSCOPY WITH PROPOFOL N/A 01/15/2017   Procedure: COLONOSCOPY WITH PROPOFOL;  Surgeon: Lollie Sails, MD;  Location: [facility] ENDOSCOPY;  Service: Endoscopy;  Laterality: N/A;  . KNEE SURGERY      There were no vitals filed for this visit.  Subjective Assessment - 07/11/17 1315    Subjective  Pt felt soreness in mid and upper back after pulling her granddtr in a wagon.  Pt lost her HEP sheet and was not able to try the new exercises    Pertinent History  auto-immune disease Sjorgen's , cardiac issues ( PVC/ tachycardia), fibromyalgia, neuropathy in B feet, osteoporosis     Patient Stated Goals  to be able to shave underarms and to gaze out car window without pain          [facility] PT Assessment - 07/11/17 1704      Observation/Other Assessments   Observations  significantly less thoracic kyphosis                   [facility] Adult PT Treatment/Exercise - 07/11/17 1706      Neuro Re-ed    Neuro Re-ed Details   see pt instructions.  Modified L lean with band with report of achiness of  muscles, after modification to promote less upper trap overuse, pt had no report of achiness               PT Education - 07/11/17 1355    Education provided  Yes    Education Details  HEP    Person(s) Educated  Patient    Methods  Explanation    Comprehension  Verbalized understanding;Returned demonstration;Verbal cues required;Tactile cues required          PT Long Term Goals - 07/11/17 1325      PT LONG TERM GOAL #1   Title  Pt will show less forward head posture/ thoracic kyphosis with increased distance between earlobe to acromium 18.5 cm R, 17.5 cm L to < 17 cm B in order to minimize risk for compression Fx with osteoporosis and to improve postural stability to m inimize neck , shoulder, and hip pain to sleep  (16 cm 1/16)     Time  12    Period  Weeks    Status  Achieved      PT LONG TERM GOAL #2   Title  Pt will decrease her NDI score from  54% to <49 % in order to shave underarms and turn head when driving    Time  12    Period  Weeks    Status  On-going      PT LONG TERM GOAL #3   Title  Pt will decrease her ODI score 36% to <31 % in order to return ADLs and households chores    Time  12    Period  Weeks    Status  On-going      PT LONG TERM GOAL #4   Title  Pt will demo proper alignment and body mechanics with lifting wood, grandbaby, and exercises in order to minimize pain and risk for injuries    Time  8    Period  Weeks    Status  Achieved      PT LONG TERM GOAL #5   Title  Pt will demo decreased abdominal scar restrictions in order to regain hip/ trunk mobility to turn body to drive    Time  4    Period  Weeks    Status  Partially Met      Additional Long Term Goals   Additional Long Term Goals  Yes      PT LONG TERM GOAL #6   Title  Pt will be IND with scoliosis HEP and compliant with wearing shoe lift      Time  10    Period  Weeks    Status  Partially Met      PT LONG TERM GOAL #7   Title  Pt will demo self-correction with perturbation  training and increased ability to step over objects and pull 80 lbs on cable column to simulate pulling dog and leash in order to minimize risks of falls. ,     Time  12    Period  Weeks    Status  On-going      PT LONG TERM GOAL #8   Title  Pt will demo proper alignment and no cuing for less lumbar lordosis with yoga poses in order to minimize risk for injuries when she attends community classes.     Time  12    Period  Weeks    Status  New    Target Date  10/03/17            Plan - 07/11/17 1707    Clinical Impression Statement  Pt showed significantly less thoracic kyphosis today after returning 3 weeks from her last session. Pt progressed to resistance band strengthening to minimize owrsening of her scoliosis. Pt would like to try community yoga classes but PT will continue to help pt progress towards this goal. Pt continues to benefit from skilled PT     Rehab Potential  Good    PT Frequency  1x / week    PT Duration  12 weeks    PT Treatment/Interventions  Moist Heat;Traction;Functional mobility training;Stair training;Patient/family education;Therapeutic activities;Therapeutic exercise;Balance training;Scar mobilization;Manual techniques;Neuromuscular re-education;Gait training    Consulted and Agree with Plan of Care  Patient       Patient will benefit from skilled therapeutic intervention in order to improve the following deficits and impairments:  Abnormal gait, Impaired UE functional use, Decreased safety awareness, Decreased endurance, Decreased activity tolerance, Decreased knowledge of precautions, Impaired vision/preception, Pain, Decreased scar mobility, Decreased balance, Decreased mobility, Impaired sensation, Decreased strength, Improper body mechanics, Hypomobility, Increased fascial restricitons  Visit Diagnosis: Other abnormalities of gait and mobility  Unequal leg length  Muscle weakness (generalized)  Repeated falls     Problem List Patient Active  Problem List   Diagnosis Date Noted  . Fibromyalgia 12/18/2016  . GERD (gastroesophageal reflux disease) 12/18/2016  . IBS (irritable bowel syndrome) 12/18/2016  . Gastroenteritis 07/03/2016  . Primary Sjogren's syndrome ([place]) 04/26/2011    Jerl Mina  ,PT, DPT, E-RYT  07/11/2017, 5:17 PM  [place] MAIN [facility] SERVICES [address] [place], [place], [address] Phone: [phone]   Fax:  [phone]  Name: Traci Lester MRN: [id number] Date of Birth: Jun 20, 1960

## 2017-07-18 ENCOUNTER — Ambulatory Visit: Payer: 59 | Admitting: Physical Therapy

## 2017-07-25 ENCOUNTER — Ambulatory Visit: Payer: 59 | Admitting: Physical Therapy

## 2017-07-25 DIAGNOSIS — M2170 Unequal limb length (acquired), unspecified site: Secondary | ICD-10-CM

## 2017-07-25 DIAGNOSIS — R296 Repeated falls: Secondary | ICD-10-CM

## 2017-07-25 DIAGNOSIS — M6281 Muscle weakness (generalized): Secondary | ICD-10-CM

## 2017-07-25 DIAGNOSIS — R2689 Other abnormalities of gait and mobility: Secondary | ICD-10-CM

## 2017-07-26 NOTE — Patient Instructions (Signed)
chin tuck, scapula stabilization with hands on chair

## 2017-07-26 NOTE — Therapy (Signed)
[place] MAIN [facility] SERVICES [address] [place], [place], [address] Phone: [phone]   Fax:  [phone]  Physical Therapy Treatment  Patient Details  Name: Traci Lester MRN: [id number] Date of Birth: Apr 29, 1961 Referring Provider: Dr. Meda Coffee    Encounter Date: 07/25/2017  PT End of Session - 07/25/17 1507    Visit Number  8    Number of Visits  12    Date for PT Re-Evaluation  09/12/17    PT Start Time  5397    PT Stop Time  1508    PT Time Calculation (min)  53 min    Activity Tolerance  Patient tolerated treatment well       Past Medical History:  Diagnosis Date  . Allergy   . Fibromyalgia   . IBS (irritable bowel syndrome)   . PVC (premature ventricular contraction)   . Sjogren's syndrome [facility])     Past Surgical History:  Procedure Laterality Date  . ABDOMINAL HYSTERECTOMY    . CESAREAN SECTION     times 3  . COLONOSCOPY WITH PROPOFOL N/A 01/15/2017   Procedure: COLONOSCOPY WITH PROPOFOL;  Surgeon: Lollie Sails, MD;  Location: [facility] ENDOSCOPY;  Service: Endoscopy;  Laterality: N/A;  . KNEE SURGERY      There were no vitals filed for this visit.  Subjective Assessment - 07/26/17 2206    Subjective  Pt reported she had two flare ups after last session     Pertinent History  auto-immune disease Sjorgen's , cardiac issues ( PVC/ tachycardia), fibromyalgia, neuropathy in B feet, osteoporosis     Patient Stated Goals  to be able to shave underarms and to gaze out car window without pain          [facility] PT Assessment - 07/26/17 2209      Observation/Other Assessments   Observations  post Tx: more relaxation, less cuing required for cervical retraction/ scapular retraction/ depression                  [facility] Adult PT Treatment/Exercise - 07/26/17 2208      Neuro Re-ed    Neuro Re-ed Details   chin tuck, scapula stabilization with hands on chair      Manual Therapy   Manual therapy comments   gentle manual STM at cranium, pelvis ,              PT Education - 07/26/17 2209    Education provided  Yes    Education Details  HEP    Person(s) Educated  Patient    Methods  Explanation;Demonstration;Tactile cues;Verbal cues    Comprehension  Returned demonstration;Verbalized understanding          PT Long Term Goals - 07/11/17 1325      PT LONG TERM GOAL #1   Title  Pt will show less forward head posture/ thoracic kyphosis with increased distance between earlobe to acromium 18.5 cm R, 17.5 cm L to < 17 cm B in order to minimize risk for compression Fx with osteoporosis and to improve postural stability to m inimize neck , shoulder, and hip pain to sleep  (16 cm 1/16)     Time  12    Period  Weeks    Status  Achieved      PT LONG TERM GOAL #2   Title  Pt will decrease her NDI score from 54% to <49 % in order to shave underarms and turn head when driving  Time  12    Period  Weeks    Status  On-going      PT LONG TERM GOAL #3   Title  Pt will decrease her ODI score 36% to <31 % in order to return ADLs and households chores    Time  12    Period  Weeks    Status  On-going      PT LONG TERM GOAL #4   Title  Pt will demo proper alignment and body mechanics with lifting wood, grandbaby, and exercises in order to minimize pain and risk for injuries    Time  8    Period  Weeks    Status  Achieved      PT LONG TERM GOAL #5   Title  Pt will demo decreased abdominal scar restrictions in order to regain hip/ trunk mobility to turn body to drive    Time  4    Period  Weeks    Status  Partially Met      Additional Long Term Goals   Additional Long Term Goals  Yes      PT LONG TERM GOAL #6   Title  Pt will be IND with scoliosis HEP and compliant with wearing shoe lift      Time  10    Period  Weeks    Status  Partially Met      PT LONG TERM GOAL #7   Title  Pt will demo self-correction with perturbation training and increased ability to step over objects and  pull 80 lbs on cable column to simulate pulling dog and leash in order to minimize risks of falls. ,     Time  12    Period  Weeks    Status  On-going      PT LONG TERM GOAL #8   Title  Pt will demo proper alignment and no cuing for less lumbar lordosis with yoga poses in order to minimize risk for injuries when she attends community classes.     Time  12    Period  Weeks    Status  New    Target Date  10/03/17            Plan - 07/26/17 2212    Clinical Impression Statement  Pt responded best to lighter manual Tx with more relaxation and gentle ROM exercises.  Pt was initiated to resistance band excercises at last session which had flared her up.  Withholding resistance band exercises. Applying isometric strengthening with a greater ratio of relaxation/ gentle ROM in HEP at this time to minimize flareups given her fibromyalgia and automimmune disorders.  Pt continues to benefit from skilled PT.    Rehab Potential  Good    PT Frequency  1x / week    PT Duration  12 weeks    PT Treatment/Interventions  Moist Heat;Traction;Functional mobility training;Stair training;Patient/family education;Therapeutic activities;Therapeutic exercise;Balance training;Scar mobilization;Manual techniques;Neuromuscular re-education;Gait training    Consulted and Agree with Plan of Care  Patient       Patient will benefit from skilled therapeutic intervention in order to improve the following deficits and impairments:  Abnormal gait, Impaired UE functional use, Decreased safety awareness, Decreased endurance, Decreased activity tolerance, Decreased knowledge of precautions, Impaired vision/preception, Pain, Decreased scar mobility, Decreased balance, Decreased mobility, Impaired sensation, Decreased strength, Improper body mechanics, Hypomobility, Increased fascial restricitons  Visit Diagnosis: Other abnormalities of gait and mobility  Unequal leg length  Muscle weakness (generalized)  Repeated  falls     Problem List Patient Active Problem List   Diagnosis Date Noted  . Fibromyalgia 12/18/2016  . GERD (gastroesophageal reflux disease) 12/18/2016  . IBS (irritable bowel syndrome) 12/18/2016  . Gastroenteritis 07/03/2016  . Primary Sjogren's syndrome ([place]) 04/26/2011    Jerl Mina ,PT, DPT, E-RYT  07/26/2017, 10:13 PM  [place] MAIN [facility] SERVICES [address] [place], [place], [address] Phone: [phone]   Fax:  [phone]  Name: Traci Lester MRN: [id number] Date of Birth: 02/16/1961

## 2017-08-01 ENCOUNTER — Ambulatory Visit: Payer: 59 | Admitting: Physical Therapy

## 2017-08-08 ENCOUNTER — Ambulatory Visit: Payer: 59 | Attending: Internal Medicine | Admitting: Physical Therapy

## 2017-08-08 VITALS — BP 128/80 | HR 95

## 2017-08-08 DIAGNOSIS — R2689 Other abnormalities of gait and mobility: Secondary | ICD-10-CM | POA: Diagnosis present

## 2017-08-08 DIAGNOSIS — M6281 Muscle weakness (generalized): Secondary | ICD-10-CM | POA: Insufficient documentation

## 2017-08-08 DIAGNOSIS — M2170 Unequal limb length (acquired), unspecified site: Secondary | ICD-10-CM

## 2017-08-08 DIAGNOSIS — R296 Repeated falls: Secondary | ICD-10-CM | POA: Diagnosis present

## 2017-08-08 NOTE — Patient Instructions (Addendum)
Restorative pose  with yoga blocks to release midback back tensions   One block vertically placed between shoulder blades and the other is sitting on its longerside perpendicular to support back of the head   untuck the hips to lengthen the back   knees bent, feet hip width apart   Gently moves knees to L ~ 20 deg to  alow trunk to fall over edge of block and to feel muscles between the shoulder blades relax  Do other side with knees        Restorative pose on incline of pillows and yoga blocks  Pillow between knees, under top arm and under head  See drawing      More restorative [email or web address]

## 2017-08-08 NOTE — Therapy (Signed)
[place] MAIN [facility] SERVICES [address] [place], [place], [address] Phone: [phone]   Fax:  [phone]  Physical Therapy Treatment  Patient Details  Name: Traci Lester MRN: [id number] Date of Birth: 04-13-1961 Referring Provider: Dr. Meda Coffee    Encounter Date: 08/08/2017  PT End of Session - 08/08/17 1347    Visit Number  9    Number of Visits  12    Date for PT Re-Evaluation  09/12/17    PT Start Time  1316    PT Stop Time  1400    PT Time Calculation (min)  44 min    Activity Tolerance  Patient tolerated treatment well       Past Medical History:  Diagnosis Date  . Allergy   . Fibromyalgia   . IBS (irritable bowel syndrome)   . PVC (premature ventricular contraction)   . Sjogren's syndrome [facility])     Past Surgical History:  Procedure Laterality Date  . ABDOMINAL HYSTERECTOMY    . CESAREAN SECTION     times 3  . COLONOSCOPY WITH PROPOFOL N/A 01/15/2017   Procedure: COLONOSCOPY WITH PROPOFOL;  Surgeon: Lollie Sails, MD;  Location: [facility] ENDOSCOPY;  Service: Endoscopy;  Laterality: N/A;  . KNEE SURGERY      Vitals:   08/08/17 1331  BP: 128/80  Pulse: 95    Subjective Assessment - 08/08/17 1320    Subjective  Pt reported feeling better after last session for a couple of days. The fibromyalgia pain gradually ramped even though she practiced gentler forms of her exercises. Pt started to take Tynenol in the morning, Alleve in the afternoon, and Advil in the evening for a week an a half. The rain and weather changes affected her pain.  Pt has felt  nausea but without fever for the past 6 th day.  Pt stopped taking the NSAIDs since yesterday.  Pt has temperature flunctuations with sweat and cold.  Pt reported she will seeing her PCP next week.      Pertinent History  auto-immune disease Sjorgen's , cardiac issues ( PVC/ tachycardia), fibromyalgia, neuropathy in B feet, osteoporosis     Patient Stated Goals  to be able  to shave underarms and to gaze out car window without pain           Assessment:  calmer presentation less thoracic kyphosis Minor tensions at L medial scapula             [facility] Adult PT Treatment/Exercise - 08/08/17 1422      Therapeutic Activites    Therapeutic Activities  -- see pt instructions      Exercises   Exercises  -- see pt instructions              PT Education - 08/08/17 1358    Education provided  Yes    Education Details  HEP    Person(s) Educated  Patient    Methods  Explanation;Demonstration;Tactile cues;Verbal cues;Handout    Comprehension  Verbal cues required;Returned demonstration;Verbalized understanding;Tactile cues required          PT Long Term Goals - 07/11/17 1325      PT LONG TERM GOAL #1   Title  Pt will show less forward head posture/ thoracic kyphosis with increased distance between earlobe to acromium 18.5 cm R, 17.5 cm L to < 17 cm B in order to minimize risk for compression Fx with osteoporosis and to improve postural stability to m inimize neck ,  shoulder, and hip pain to sleep  (16 cm 1/16)     Time  12    Period  Weeks    Status  Achieved      PT LONG TERM GOAL #2   Title  Pt will decrease her NDI score from 54% to <49 % in order to shave underarms and turn head when driving    Time  12    Period  Weeks    Status  On-going      PT LONG TERM GOAL #3   Title  Pt will decrease her ODI score 36% to <31 % in order to return ADLs and households chores    Time  12    Period  Weeks    Status  On-going      PT LONG TERM GOAL #4   Title  Pt will demo proper alignment and body mechanics with lifting wood, grandbaby, and exercises in order to minimize pain and risk for injuries    Time  8    Period  Weeks    Status  Achieved      PT LONG TERM GOAL #5   Title  Pt will demo decreased abdominal scar restrictions in order to regain hip/ trunk mobility to turn body to drive    Time  4    Period  Weeks    Status   Partially Met      Additional Long Term Goals   Additional Long Term Goals  Yes      PT LONG TERM GOAL #6   Title  Pt will be IND with scoliosis HEP and compliant with wearing shoe lift      Time  10    Period  Weeks    Status  Partially Met      PT LONG TERM GOAL #7   Title  Pt will demo self-correction with perturbation training and increased ability to step over objects and pull 80 lbs on cable column to simulate pulling dog and leash in order to minimize risks of falls. ,     Time  12    Period  Weeks    Status  On-going      PT LONG TERM GOAL #8   Title  Pt will demo proper alignment and no cuing for less lumbar lordosis with yoga poses in order to minimize risk for injuries when she attends community classes.     Time  12    Period  Weeks    Status  New    Target Date  10/03/17            Plan - 08/08/17 1439    Clinical Impression Statement Pt reported she felt better in her muscle aches for a few days before her pain returned.  Pt states the weather changes have also impacted her. Pt had been taking Advil, Tylenol, and Alleve for 1.5 week due to her fibromyalgia pain. Pt reported she has had nausea for the past 6 days with changes in body temperature. Pt denied fever nor vomitting.  Pt refused being sent to the Urgent Care today and stated she has already made an appointment with her PCP. Her BP was 120/80 mm Hg.   Today, pt was introduced to restorative yoga and the use of props to relieve the L thoracic mm tensions. Following Tx, pt reported her nausea decreased and she felt relaxed. Pt was educated on more resources to help pt set up this practice at home into  her daily routine. Pt continues to benefit from skilled PT. Plan to educate pt on gentle yoga practice with gravity eliminated positions to minimize overuse of her mm. Pt is maintaining her other HEP and demonsrates signficantly less thoracic kyphosis.     Rehab Potential  Good    PT Frequency  1x / week    PT  Duration  12 weeks    PT Treatment/Interventions  Moist Heat;Traction;Functional mobility training;Stair training;Patient/family education;Therapeutic activities;Therapeutic exercise;Balance training;Scar mobilization;Manual techniques;Neuromuscular re-education;Gait training    Consulted and Agree with Plan of Care  Patient       Patient will benefit from skilled therapeutic intervention in order to improve the following deficits and impairments:  Abnormal gait, Impaired UE functional use, Decreased safety awareness, Decreased endurance, Decreased activity tolerance, Decreased knowledge of precautions, Impaired vision/preception, Pain, Decreased scar mobility, Decreased balance, Decreased mobility, Impaired sensation, Decreased strength, Improper body mechanics, Hypomobility, Increased fascial restricitons  Visit Diagnosis: Other abnormalities of gait and mobility  Unequal leg length  Muscle weakness (generalized)  Repeated falls     Problem List Patient Active Problem List   Diagnosis Date Noted  . Fibromyalgia 12/18/2016  . GERD (gastroesophageal reflux disease) 12/18/2016  . IBS (irritable bowel syndrome) 12/18/2016  . Gastroenteritis 07/03/2016  . Primary Sjogren's syndrome ([place]) 04/26/2011    Jerl Mina  ,PT, DPT, E-RYT   08/08/2017, 2:48 PM  [place] MAIN [facility] SERVICES [address] [place], [place], [address] Phone: [phone]   Fax:  [phone]  Name: Traci Lester MRN: [id number] Date of Birth: 1961-06-04

## 2017-08-13 ENCOUNTER — Other Ambulatory Visit: Payer: Self-pay | Admitting: Internal Medicine

## 2017-08-13 DIAGNOSIS — R1010 Upper abdominal pain, unspecified: Secondary | ICD-10-CM

## 2017-08-15 ENCOUNTER — Encounter: Payer: Medicare Other | Admitting: Physical Therapy

## 2017-08-17 ENCOUNTER — Ambulatory Visit
Admission: RE | Admit: 2017-08-17 | Discharge: 2017-08-17 | Disposition: A | Discharge status: Home / Self Care | Payer: 59 | Source: Ambulatory Visit | Attending: Internal Medicine | Admitting: Internal Medicine

## 2017-08-17 DIAGNOSIS — R1010 Upper abdominal pain, unspecified: Secondary | ICD-10-CM

## 2017-08-22 ENCOUNTER — Ambulatory Visit: Payer: 59 | Admitting: Physical Therapy

## 2017-08-22 DIAGNOSIS — M2170 Unequal limb length (acquired), unspecified site: Secondary | ICD-10-CM

## 2017-08-22 DIAGNOSIS — M6281 Muscle weakness (generalized): Secondary | ICD-10-CM

## 2017-08-22 DIAGNOSIS — R2689 Other abnormalities of gait and mobility: Secondary | ICD-10-CM | POA: Diagnosis not present

## 2017-08-22 DIAGNOSIS — R296 Repeated falls: Secondary | ICD-10-CM

## 2017-08-22 NOTE — Patient Instructions (Addendum)
Car trip preparation   _fluffly thick towel folded long ways into thirds against car seat for the spinal alignment   _stretches  pects with passenger seat and roof of car hip flexor with foot on top of the platform, --> twist  Standing child poses   ______   Practice weight shift in ski track stance when pulling dog on leash   _____  [employer], dementia resource for father in Social worker[place]

## 2017-08-24 NOTE — Therapy (Signed)
[place] MAIN [facility] SERVICES [address] [place], [place], [address] Phone: [phone]   Fax:  [phone]  Physical Therapy Treatment  Patient Details  Name: KENSINGTON RIOS MRN: [id number] Date of Birth: 1960-06-23 Referring Provider: Dr. Meda Coffee    Encounter Date: 08/22/2017    Past Medical History:  Diagnosis Date  . Allergy   . Fibromyalgia   . IBS (irritable bowel syndrome)   . PVC (premature ventricular contraction)   . Sjogren's syndrome [facility])     Past Surgical History:  Procedure Laterality Date  . ABDOMINAL HYSTERECTOMY    . CESAREAN SECTION     times 3  . COLONOSCOPY WITH PROPOFOL N/A 01/15/2017   Procedure: COLONOSCOPY WITH PROPOFOL;  Surgeon: Lollie Sails, MD;  Location: [facility] ENDOSCOPY;  Service: Endoscopy;  Laterality: N/A;  . KNEE SURGERY      There were no vitals filed for this visit.  Subjective Assessment - 08/24/17 1250    Subjective  Pt reported her PCP added 3 new medications which has helped her pain. Pt notices she is dealing with  more fatigue especially when helping out her family in law and grandchild on the same day.   She is currently helping with her father in law and his recent Dx of dementia   Pertinent History  auto-immune disease Sjorgen's , cardiac issues ( PVC/ tachycardia), fibromyalgia, neuropathy in B feet, osteoporosis     Patient Stated Goals  to be able to shave underarms and to gaze out car window without pain          [facility] PT Assessment - 08/24/17 1242      Observation/Other Assessments   Observations  significantly improved posture, without increased thoracic kyphosis, lumbar lordosis       Simulated pulling on leash, demo'd poor stabilizing strategy with poor postural stability      No data recorded       [facility] Adult PT Treatment/Exercise - 08/24/17 1242      Therapeutic Activites    Therapeutic Activities  -- simulated pulling dog on leash, cued for lower  COM, 30 ft      Neuro Re-ed    Neuro Re-ed Details   see pt instructions.  Modified L lean with band with report of achiness of muscles, after modification to promote less upper trap overuse, pt had no report of achiness                    PT Long Term Goals - 08/22/17 1343      PT LONG TERM GOAL #1   Title  Pt will show less forward head posture/ thoracic kyphosis with increased distance between earlobe to acromium 18.5 cm R, 17.5 cm L to < 17 cm B in order to minimize risk for compression Fx with osteoporosis and to improve postural stability to m inimize neck , shoulder, and hip pain to sleep  (16 cm 1/16)     Time  12    Period  Weeks    Status  Achieved      PT LONG TERM GOAL #2   Title  Pt will decrease her NDI score from 54% to <49 % in order to shave underarms and turn head when driving  (7/49: 44%)     Time  12    Period  Weeks    Status  Achieved      PT LONG TERM GOAL #3   Title  Pt will decrease  her ODI score 36% to <31 % in order to return ADLs and households chores (3/20: 42%)     Time  12    Period  Weeks    Status  Not Met      PT LONG TERM GOAL #4   Title  Pt will demo proper alignment and body mechanics with lifting wood, grandbaby, and exercises in order to minimize pain and risk for injuries    Time  8    Period  Weeks    Status  Achieved      PT LONG TERM GOAL #5   Title  Pt will demo decreased abdominal scar restrictions in order to regain hip/ trunk mobility to turn body to drive    Time  4    Period  Weeks    Status  Achieved      PT LONG TERM GOAL #6   Title  Pt will be IND with scoliosis HEP and compliant with wearing shoe lift      Time  10    Period  Weeks    Status  Achieved      PT LONG TERM GOAL #7   Title  Pt will demo self-correction with perturbation training and increased ability to step over objects and pull 80 lbs on cable column to simulate pulling dog and leash in order to minimize risks of falls. ,     Time  12     Period  Weeks    Status  Achieved      PT LONG TERM GOAL #8   Title  Pt will demo proper alignment and no cuing for less lumbar lordosis with yoga poses in order to minimize risk for injuries when she attends community classes.     Time  12    Period  Weeks    Status  Achieved            Plan - 08/24/17 1252    Clinical Impression Statement  Pt has achieved 7/8 goals with signficantly improved back and neck pain. Pt now shows signifcaintly less thoracic kyphosis, reports her Sx have improved "Quite a Bit Better ( 5/7)" on the [facility] scale since she started PT. Pt plans on managing her fatigue with strategies discussed. She is IND with her HEP and relaxation practices .  She has been managing her pain better along with new medications. Discussed d/c but pt will contact clinic to decide whether she would like to maintain a once a month frequency to ensure maintainence strategies.       Rehab Potential  Good    PT Frequency  1x / week    PT Duration  12 weeks    PT Treatment/Interventions  Moist Heat;Traction;Functional mobility training;Stair training;Patient/family education;Therapeutic activities;Therapeutic exercise;Balance training;Scar mobilization;Manual techniques;Neuromuscular re-education;Gait training    Consulted and Agree with Plan of Care  Patient       Patient will benefit from skilled therapeutic intervention in order to improve the following deficits and impairments:  Abnormal gait, Impaired UE functional use, Decreased safety awareness, Decreased endurance, Decreased activity tolerance, Decreased knowledge of precautions, Impaired vision/preception, Pain, Decreased scar mobility, Decreased balance, Decreased mobility, Impaired sensation, Decreased strength, Improper body mechanics, Hypomobility, Increased fascial restricitons  Visit Diagnosis: Other abnormalities of gait and mobility  Unequal leg length  Muscle weakness (generalized)  Repeated falls     Problem  List Patient Active Problem List   Diagnosis Date Noted  . Fibromyalgia 12/18/2016  . GERD (gastroesophageal reflux disease)  12/18/2016  . IBS (irritable bowel syndrome) 12/18/2016  . Gastroenteritis 07/03/2016  . Primary Sjogren's syndrome ([place]) 04/26/2011    Jerl Mina ,PT, DPT, E-RYT  08/24/2017, 12:52 PM  [place] MAIN [facility] SERVICES [address] [place], [place], [address] Phone: [phone]   Fax:  [phone]  Name: SEATTLE DALPORTO MRN: [id number] Date of Birth: Apr 20, 1961

## 2017-08-29 ENCOUNTER — Encounter: Payer: Medicare Other | Admitting: Physical Therapy

## 2017-09-05 ENCOUNTER — Ambulatory Visit: Payer: 59 | Attending: Internal Medicine | Admitting: Physical Therapy

## 2017-10-03 ENCOUNTER — Ambulatory Visit: Payer: 59 | Admitting: Physical Therapy

## 2017-10-22 ENCOUNTER — Other Ambulatory Visit: Payer: Self-pay | Admitting: Internal Medicine

## 2017-10-22 DIAGNOSIS — Z1231 Encounter for screening mammogram for malignant neoplasm of breast: Secondary | ICD-10-CM

## 2017-11-07 ENCOUNTER — Encounter: Payer: 59 | Admitting: Physical Therapy

## 2017-11-30 ENCOUNTER — Ambulatory Visit
Admission: RE | Admit: 2017-11-30 | Discharge: 2017-11-30 | Disposition: A | Discharge status: Home / Self Care | Payer: 59 | Source: Ambulatory Visit | Attending: Internal Medicine | Admitting: Internal Medicine

## 2017-11-30 DIAGNOSIS — Z1231 Encounter for screening mammogram for malignant neoplasm of breast: Secondary | ICD-10-CM

## 2017-12-03 ENCOUNTER — Other Ambulatory Visit: Payer: Self-pay | Admitting: Internal Medicine

## 2017-12-03 DIAGNOSIS — N6320 Unspecified lump in the left breast, unspecified quadrant: Secondary | ICD-10-CM

## 2017-12-03 DIAGNOSIS — R928 Other abnormal and inconclusive findings on diagnostic imaging of breast: Secondary | ICD-10-CM

## 2017-12-05 ENCOUNTER — Encounter: Payer: 59 | Admitting: Physical Therapy

## 2017-12-07 ENCOUNTER — Ambulatory Visit
Admission: RE | Admit: 2017-12-07 | Discharge: 2017-12-07 | Disposition: A | Discharge status: Home / Self Care | Payer: 59 | Source: Ambulatory Visit | Attending: Internal Medicine | Admitting: Internal Medicine

## 2017-12-07 DIAGNOSIS — N6320 Unspecified lump in the left breast, unspecified quadrant: Secondary | ICD-10-CM

## 2017-12-07 DIAGNOSIS — R928 Other abnormal and inconclusive findings on diagnostic imaging of breast: Secondary | ICD-10-CM | POA: Insufficient documentation

## 2018-01-09 ENCOUNTER — Encounter: Payer: 59 | Admitting: Physical Therapy

## 2018-02-05 ENCOUNTER — Other Ambulatory Visit: Payer: Self-pay | Admitting: Internal Medicine

## 2018-02-05 DIAGNOSIS — R599 Enlarged lymph nodes, unspecified: Secondary | ICD-10-CM

## 2018-03-11 ENCOUNTER — Ambulatory Visit
Admission: RE | Admit: 2018-03-11 | Discharge: 2018-03-11 | Disposition: A | Discharge status: Home / Self Care | Payer: 59 | Source: Ambulatory Visit | Attending: Internal Medicine | Admitting: Internal Medicine

## 2018-03-11 DIAGNOSIS — R599 Enlarged lymph nodes, unspecified: Secondary | ICD-10-CM | POA: Insufficient documentation

## 2018-10-15 ENCOUNTER — Other Ambulatory Visit: Payer: Self-pay | Admitting: Internal Medicine

## 2018-10-15 DIAGNOSIS — R599 Enlarged lymph nodes, unspecified: Secondary | ICD-10-CM

## 2018-10-30 ENCOUNTER — Other Ambulatory Visit: Payer: Self-pay | Admitting: Internal Medicine

## 2018-10-30 DIAGNOSIS — Z1231 Encounter for screening mammogram for malignant neoplasm of breast: Secondary | ICD-10-CM

## 2018-10-30 DIAGNOSIS — R599 Enlarged lymph nodes, unspecified: Secondary | ICD-10-CM

## 2018-11-04 ENCOUNTER — Emergency Department (HOSPITAL_COMMUNITY)
Admission: EM | Admit: 2018-11-04 | Discharge: 2018-11-04 | Disposition: A | Discharge status: Other Acute Inpatient Hospital | Payer: 59 | Source: Home / Self Care | Attending: Emergency Medicine | Admitting: Emergency Medicine

## 2018-11-04 ENCOUNTER — Inpatient Hospital Stay
Admission: AD | Admit: 2018-11-04 | Discharge: 2018-11-06 | DRG: 885 | Disposition: A | Discharge status: Home / Self Care | Payer: 59 | Source: Intra-hospital | Attending: Psychiatry | Admitting: Psychiatry

## 2018-11-04 ENCOUNTER — Other Ambulatory Visit: Payer: Self-pay

## 2018-11-04 DIAGNOSIS — M3500 Sicca syndrome, unspecified: Secondary | ICD-10-CM | POA: Diagnosis present

## 2018-11-04 DIAGNOSIS — Z1159 Encounter for screening for other viral diseases: Secondary | ICD-10-CM

## 2018-11-04 DIAGNOSIS — K589 Irritable bowel syndrome without diarrhea: Secondary | ICD-10-CM

## 2018-11-04 DIAGNOSIS — K219 Gastro-esophageal reflux disease without esophagitis: Secondary | ICD-10-CM | POA: Diagnosis present

## 2018-11-04 DIAGNOSIS — R45851 Suicidal ideations: Secondary | ICD-10-CM

## 2018-11-04 DIAGNOSIS — M797 Fibromyalgia: Secondary | ICD-10-CM | POA: Diagnosis present

## 2018-11-04 DIAGNOSIS — F332 Major depressive disorder, recurrent severe without psychotic features: Secondary | ICD-10-CM | POA: Insufficient documentation

## 2018-11-04 DIAGNOSIS — G4700 Insomnia, unspecified: Secondary | ICD-10-CM | POA: Diagnosis present

## 2018-11-04 DIAGNOSIS — Z79899 Other long term (current) drug therapy: Secondary | ICD-10-CM | POA: Insufficient documentation

## 2018-11-04 LAB — COMPREHENSIVE METABOLIC PANEL
ALT: 16 U/L (ref 0–44)
AST: 21 U/L (ref 15–41)
Albumin: 4.5 g/dL (ref 3.5–5.0)
Alkaline Phosphatase: 47 U/L (ref 38–126)
Anion gap: 9 (ref 5–15)
BUN: 13 mg/dL (ref 6–20)
CO2: 23 mmol/L (ref 22–32)
Calcium: 9.5 mg/dL (ref 8.9–10.3)
Chloride: 105 mmol/L (ref 98–111)
Creatinine, Ser: 0.86 mg/dL (ref 0.44–1.00)
GFR calc Af Amer: >60 mL/min (ref >60)
GFR calc non Af Amer: >60 mL/min (ref >60)
Glucose, Bld: 102 mg/dL — ABNORMAL HIGH (ref 70–99)
Potassium: 3.8 mmol/L (ref 3.5–5.1)
Sodium: 137 mmol/L (ref 135–145)
Total Bilirubin: 0.6 mg/dL (ref 0.3–1.2)
Total Protein: 7.9 g/dL (ref 6.5–8.1)

## 2018-11-04 LAB — CBC
HCT: 41.4 % (ref 36.0–46.0)
Hemoglobin: 14 g/dL (ref 12.0–15.0)
MCH: 31.6 pg (ref 26.0–34.0)
MCHC: 33.8 g/dL (ref 30.0–36.0)
MCV: 93.5 fL (ref 80.0–100.0)
Platelets: 194 10*3/uL (ref 150–400)
RBC: 4.43 MIL/uL (ref 3.87–5.11)
RDW: 13 % (ref 11.5–15.5)
WBC: 5.8 10*3/uL (ref 4.0–10.5)
nRBC: 0 % (ref 0.0–0.2)

## 2018-11-04 LAB — SALICYLATE LEVEL: Salicylate Lvl: <7 mg/dL (ref 2.8–30.0)

## 2018-11-04 LAB — ETHANOL: Alcohol, Ethyl (B): <10 mg/dL (ref <10)

## 2018-11-04 LAB — SARS CORONAVIRUS 2 BY RT PCR (HOSPITAL ORDER, PERFORMED IN ~~LOC~~ HOSPITAL LAB): SARS Coronavirus 2: NEGATIVE

## 2018-11-04 LAB — URINE DRUG SCREEN, QUALITATIVE (ARMC ONLY)
Amphetamines, Ur Screen: NOT DETECTED
Barbiturates, Ur Screen: NOT DETECTED
Benzodiazepine, Ur Scrn: POSITIVE — AB
Cannabinoid 50 Ng, Ur ~~LOC~~: NOT DETECTED
Cocaine Metabolite,Ur ~~LOC~~: NOT DETECTED
MDMA (Ecstasy)Ur Screen: NOT DETECTED
Methadone Scn, Ur: NOT DETECTED
Opiate, Ur Screen: NOT DETECTED
Phencyclidine (PCP) Ur S: NOT DETECTED
Tricyclic, Ur Screen: POSITIVE — AB

## 2018-11-04 LAB — ACETAMINOPHEN LEVEL: Acetaminophen (Tylenol), Serum: <10 ug/mL — ABNORMAL LOW (ref 10–30)

## 2018-11-04 MED ORDER — METOPROLOL SUCCINATE ER 50 MG PO TB24: 25.0000 mg | ORAL_TABLET | Freq: Every day | ORAL | Status: DC

## 2018-11-04 MED ORDER — ACETAMINOPHEN 325 MG PO TABS
650.0000 mg | ORAL_TABLET | ORAL | Status: DC | PRN
  Administered 2018-11-05: 650 mg via ORAL
  Filled 2018-11-04: qty 2

## 2018-11-04 MED ORDER — ZIPRASIDONE MESYLATE 20 MG IM SOLR: 20.0000 mg | INTRAMUSCULAR | Status: DC | PRN

## 2018-11-04 MED ORDER — LORAZEPAM 1 MG PO TABS: 1.0000 mg | ORAL_TABLET | ORAL | Status: DC | PRN

## 2018-11-04 MED ORDER — DULOXETINE HCL 30 MG PO CPEP: 30.0000 mg | ORAL_CAPSULE | Freq: Every day | ORAL | Status: DC

## 2018-11-04 MED ORDER — PANTOPRAZOLE SODIUM 40 MG PO TBEC: 40.0000 mg | DELAYED_RELEASE_TABLET | Freq: Every day | ORAL | Status: DC

## 2018-11-04 MED ORDER — ZOLPIDEM TARTRATE 10 MG PO TABS: 10.0000 mg | ORAL_TABLET | Freq: Every evening | ORAL | Status: DC | PRN

## 2018-11-04 MED ORDER — ALPRAZOLAM 0.5 MG PO TABS
0.5000 mg | ORAL_TABLET | Freq: Every evening | ORAL | Status: DC | PRN
  Administered 2018-11-05: 0.5 mg via ORAL
  Filled 2018-11-04: qty 1

## 2018-11-04 MED ORDER — MAGNESIUM HYDROXIDE 400 MG/5ML PO SUSP: 30.0000 mL | Freq: Every day | ORAL | Status: DC | PRN

## 2018-11-04 MED ORDER — PREGABALIN 75 MG PO CAPS: 75.0000 mg | ORAL_CAPSULE | Freq: Two times a day (BID) | ORAL | Status: DC

## 2018-11-04 MED ORDER — ZOLPIDEM TARTRATE 5 MG PO TABS: 5.0000 mg | ORAL_TABLET | Freq: Every evening | ORAL | Status: DC | PRN

## 2018-11-04 MED ORDER — METOPROLOL SUCCINATE ER 50 MG PO TB24
25.0000 mg | ORAL_TABLET | Freq: Every day | ORAL | Status: DC
  Administered 2018-11-04: 25 mg via ORAL
  Filled 2018-11-04: qty 1

## 2018-11-04 MED ORDER — AMITRIPTYLINE HCL 50 MG PO TABS: 25.0000 mg | ORAL_TABLET | Freq: Every day | ORAL | Status: DC

## 2018-11-04 MED ORDER — PROGESTERONE MICRONIZED 100 MG PO CAPS
100.0000 mg | ORAL_CAPSULE | Freq: Every day | ORAL | Status: DC
  Administered 2018-11-06: 100 mg via ORAL
  Filled 2018-11-04 (×3): qty 1

## 2018-11-04 MED ORDER — FAMOTIDINE 20 MG PO TABS: 20.0000 mg | ORAL_TABLET | Freq: Every day | ORAL | Status: DC

## 2018-11-04 MED ORDER — ALUM & MAG HYDROXIDE-SIMETH 200-200-20 MG/5ML PO SUSP: 30.0000 mL | Freq: Four times a day (QID) | ORAL | Status: DC | PRN

## 2018-11-04 MED ORDER — IBUPROFEN 200 MG PO TABS
200.0000 mg | ORAL_TABLET | Freq: Four times a day (QID) | ORAL | Status: DC | PRN
  Administered 2018-11-05: 200 mg via ORAL
  Filled 2018-11-04: qty 1

## 2018-11-04 MED ORDER — RALOXIFENE HCL 60 MG PO TABS
60.0000 mg | ORAL_TABLET | Freq: Every day | ORAL | Status: DC
  Administered 2018-11-05 – 2018-11-06 (×2): 60 mg via ORAL
  Filled 2018-11-04 (×3): qty 1

## 2018-11-04 MED ORDER — ALUM & MAG HYDROXIDE-SIMETH 200-200-20 MG/5ML PO SUSP: 30.0000 mL | ORAL | Status: DC | PRN

## 2018-11-04 MED ORDER — EYE WASH OPHTH SOLN
1.0000 [drp] | OPHTHALMIC | Status: DC | PRN
  Filled 2018-11-04: qty 118

## 2018-11-04 MED ORDER — ALPRAZOLAM 0.5 MG PO TABS: 0.5000 mg | ORAL_TABLET | Freq: Every evening | ORAL | Status: DC | PRN

## 2018-11-04 MED ORDER — ACETAMINOPHEN 325 MG PO TABS: 650.0000 mg | ORAL_TABLET | Freq: Four times a day (QID) | ORAL | Status: DC | PRN

## 2018-11-04 MED ORDER — BUPROPION HCL ER (XL) 150 MG PO TB24
150.0000 mg | ORAL_TABLET | Freq: Every day | ORAL | Status: DC
  Administered 2018-11-05: 150 mg via ORAL
  Filled 2018-11-04: qty 1

## 2018-11-04 MED ORDER — ZOLPIDEM TARTRATE 5 MG PO TABS
5.0000 mg | ORAL_TABLET | Freq: Every evening | ORAL | Status: DC | PRN
  Administered 2018-11-05: 5 mg via ORAL
  Filled 2018-11-04: qty 1

## 2018-11-04 MED ORDER — ACETAMINOPHEN 325 MG PO TABS
650.0000 mg | ORAL_TABLET | ORAL | Status: DC | PRN
  Administered 2018-11-04: 650 mg via ORAL
  Filled 2018-11-04: qty 2

## 2018-11-04 MED ORDER — RALOXIFENE HCL 60 MG PO TABS: 60.0000 mg | ORAL_TABLET | Freq: Every day | ORAL | Status: DC

## 2018-11-04 MED ORDER — ONDANSETRON HCL 4 MG PO TABS: 4.0000 mg | ORAL_TABLET | Freq: Three times a day (TID) | ORAL | Status: DC | PRN

## 2018-11-04 MED ORDER — BUPROPION HCL ER (XL) 150 MG PO TB24
150.0000 mg | ORAL_TABLET | Freq: Every day | ORAL | Status: DC
  Administered 2018-11-04: 150 mg via ORAL
  Filled 2018-11-04: qty 1

## 2018-11-04 MED ORDER — ALPRAZOLAM 0.5 MG PO TABS: 0.2500 mg | ORAL_TABLET | Freq: Every evening | ORAL | Status: DC | PRN

## 2018-11-04 MED ORDER — RISPERIDONE 1 MG PO TBDP
2.0000 mg | ORAL_TABLET | Freq: Three times a day (TID) | ORAL | Status: DC | PRN
  Filled 2018-11-04: qty 2

## 2018-11-04 MED ORDER — PROGESTERONE MICRONIZED 100 MG PO CAPS
100.0000 mg | ORAL_CAPSULE | Freq: Every day | ORAL | Status: DC
  Administered 2018-11-04: 100 mg via ORAL
  Filled 2018-11-04: qty 1

## 2018-11-04 MED ORDER — ACETAMINOPHEN 500 MG PO TABS
1000.0000 mg | ORAL_TABLET | Freq: Once | ORAL | Status: AC
  Administered 2018-11-04: 11:00:00 1000 mg via ORAL
  Filled 2018-11-04: qty 2

## 2018-11-04 MED ORDER — PILOCARPINE HCL 5 MG PO TABS: 5.0000 mg | ORAL_TABLET | Freq: Two times a day (BID) | ORAL | Status: DC

## 2018-11-04 MED ORDER — IBUPROFEN 200 MG PO TABS
200.0000 mg | ORAL_TABLET | Freq: Four times a day (QID) | ORAL | Status: DC | PRN
  Filled 2018-11-04: qty 1

## 2018-11-04 NOTE — ED Notes (Signed)
Hourly rounding reveals patient in room. No complaints, stable, in no acute distress. Q15 minute rounds and monitoring via Security Cameras to continue. 

## 2018-11-04 NOTE — ED Notes (Signed)
Sent blood and urine to lab.

## 2018-11-04 NOTE — ED Notes (Signed)
Hourly rounding reveals patient sleeping in room. No complaints, stable, in no acute distress. Q15 minute rounds and monitoring via Security Cameras to continue. 

## 2018-11-04 NOTE — BH Assessment (Signed)
Patient is to be admitted to [facility] by Dr. Viviano Simas.  Attending Physician will be Dr. Toni Amend.   Patient has been assigned to room 310, by [facility] Charge Nurse T[place]awn.   Intake Paper Work has been signed and placed on patient chart.  ER staff is aware of the admission:  Misty Stanley, ER Secretary    Dr. Marisa Severin, ER MD   Geralynn Ochs, Patient's Nurse   Sharmon Leyden, Patient Access.

## 2018-11-04 NOTE — ED Notes (Signed)
Pt belongings, white pants with grey belt, peach colored shirt.brown sandles, white underwear, white bra.apple watch with silver band, silver and diamond wedding band.

## 2018-11-04 NOTE — ED Notes (Signed)
Pt states she is worse person in the world when asked why she is here. Teary, sad. Pt states that she wants to hurt herself by overdosing on medication. Attempted to contact therapist this am, therapist out of office on Monday.

## 2018-11-04 NOTE — ED Notes (Signed)
BEHAVIORAL HEALTH ROUNDING Patient sleeping: No. Patient alert and oriented: yes Behavior appropriate: Yes.  ; If no, describe:  Nutrition and fluids offered: yes Toileting and hygiene offered: Yes  Sitter present: q15 minute observations and security  monitoring Law enforcement present: Yes  ODS  

## 2018-11-04 NOTE — ED Notes (Signed)

## 2018-11-04 NOTE — ED Notes (Signed)
Report to include Situation, Background, Assessment, and Recommendations received from Jadeka RN. Patient alert and oriented, warm and dry, in no acute distress. Patient denies SI, HI, AVH and pain. Patient made aware of Q15 minute rounds and security cameras for their safety. Patient instructed to come to me with needs or concerns. 

## 2018-11-04 NOTE — ED Notes (Signed)
She has ambulated and from the BR with a steady gait  - I introduced myself to her   Mouth moisturizer provided per her request   Sad depressed effect  Plan of care discussed including pending psychiatric consult  Continue to monitor

## 2018-11-04 NOTE — BH Assessment (Signed)
Assessment Note  Traci Lester is an 58 y.o. female who presents to ED with increased depression and passive suicidal ideations. Pt reports she has had passive thoughts to "overdose". Pt has access to Rx medications. Pt denied past attempts; however, pt states she wrote a suicide note in April of 2019. Pt has guns in her home that are locked in a safe - where her husband has access to the passcode/key. She further reports "I've had a lifetime of pain. I think I've always been depressed. My husband and I have been having ups and downs for awhile. I feel like I can't do anything right." Pt currently lives with her husband, 3 adult children (25yo, 24yo, and 22yo), son-in-law, and 2yo grandbaby. Pt reports her husband and son-in-law were recently furloughed from their jobs due to COVID-19; therefore, this has caused a financial strain within her home. Pt reports feelings of hopelessness, worthlessness, guilt, tearfulness, weight loss (25 lbs), and decreased sleep patterns (4-5 hours). She is currently receiving outpatient therapy "once every other week" to address her anxiety and depression. Pt is currently prescribed Wellbutrin XR-150mg , Xanax-0.5mg , and Ambien by her PCP - Dr. Osborne Oman [facility]). When asked her expectations from presenting to the hospital she reported "I just want to shut my mind off". Pt denies past inpatient hospitalizations. Pt denied HI/AVH. Pt was not observed responding to internal stimuli. Pt was alert and oriented x4; with a flat affect while speaking with this Clinical research associate.   Diagnosis: Major Depressive Disorder  Past Medical History:  Past Medical History:  Diagnosis Date  . Allergy   . Fibromyalgia   . IBS (irritable bowel syndrome)   . PVC (premature ventricular contraction)   . Sjogren's syndrome [facility])     Past Surgical History:  Procedure Laterality Date  . ABDOMINAL HYSTERECTOMY    . CESAREAN SECTION     times 3  . COLONOSCOPY WITH  PROPOFOL N/A 01/15/2017   Procedure: COLONOSCOPY WITH PROPOFOL;  Surgeon: Christena Deem, MD;  Location: [facility] ENDOSCOPY;  Service: Endoscopy;  Laterality: N/A;  . KNEE SURGERY      Family History:  Family History  Problem Relation Age of Onset  . Breast cancer Mother 54  . Breast cancer Maternal Aunt 65    Social History:  reports that she has never smoked. She has never used smokeless tobacco. She reports current alcohol use of about 3.0 standard drinks of alcohol per week. She reports that she does not use drugs.  Additional Social History:  Alcohol / Drug Use Pain Medications: See MAR Prescriptions: See MAR Over the Counter: See MAR History of alcohol / drug use?: Yes Longest period of sobriety (when/how long): UKN Negative Consequences of Use: (None Reported) Withdrawal Symptoms: (None Reported) Substance #1 Name of Substance 1: Alcohol 1 - Age of First Use: Pt unable to recall 1 - Amount (size/oz): "a glass of wine or a wine cooler" 1 - Frequency: "once or twice a week" 1 - Duration: Unable to quantify 1 - Last Use / Amount: Unable to quantify  CIWA: CIWA-Ar BP: 130/81 Pulse Rate: 88 COWS:    Allergies:  Allergies  Allergen Reactions  . Codeine Other (See Comments)    Other Reaction: stomach pains Abdominal pain   . Duloxetine Other (See Comments)  . Other Diarrhea    Dairy containing products Wheat containing products  . Tramadol Hives  . Wheat Bran Diarrhea    Wheat and wheat containing products  . Sulfa Antibiotics  Home Medications: (Not in a hospital admission)   OB/GYN Status:  No LMP recorded. Patient has had a hysterectomy.  General Assessment Data [facility] Assessment: In system Is this a Tele or Face-to-Face Assessment?: Face-to-Face Is this an Initial Assessment or a Re-assessment for this encounter?: Initial Assessment Patient Accompanied by:: N/A Language Other than English: No Living Arrangements: Other  (Comment)(Private Residence) What gender do you identify as?: Female Marital status: Married ArtistMaiden name: Otho BellowsUKN Pregnancy Status: No Living Arrangements: Spouse/significant other, Children, Other relatives Can pt return to current living arrangement?: Yes Admission Status: Voluntary Is patient capable of signing voluntary admission?: Yes Referral Source: Self/Family/Friend [facility]  Medical Screening Exam [facility] Walk-in ONLY) Medical Exam completed: Yes  Crisis Care Plan Living Arrangements: Spouse/significant other, Children, Other relatives Legal Guardian: Other:(Self) Name of Psychiatrist: None Reported Name of Therapist: Outpatient Therapy  Education Status Is patient currently in school?: No Is the patient employed, unemployed or receiving disability?: Unemployed  Risk to self with the past 6 months Suicidal Ideation: Yes-Currently Present Has patient been a risk to self within the past 6 months prior to admission? : Yes Suicidal Intent: Yes-Currently Present Has patient had any suicidal intent within the past 6 months prior to admission? : Yes Is patient at risk for suicide?: Yes Suicidal Plan?: Yes-Currently Present Has patient had any suicidal plan within the past 6 months prior to admission? : Yes Specify Current Suicidal Plan: Pt reports having a vague plan to overdose Access to Means: Yes Specify Access to Suicidal Means: Pt has access to Rx pills What has been your use of drugs/alcohol within the last 12 months?: Alcohol (occasionally) Previous Attempts/Gestures: No How many times?: 0 Other Self Harm Risks: None Triggers for Past Attempts: None known Intentional Self Injurious Behavior: None Family Suicide History: No Recent stressful life event(s): Job Loss, Financial Problems, Conflict (Comment) Persecutory voices/beliefs?: No Depression: Yes Depression Symptoms: Despondent, Insomnia, Tearfulness, Isolating, Fatigue, Guilt, Loss of interest in usual  pleasures, Feeling worthless/self pity Substance abuse history and/or treatment for substance abuse?: No Suicide prevention information given to non-admitted patients: Not applicable  Risk to Others within the past 6 months Homicidal Ideation: No Does patient have any lifetime risk of violence toward others beyond the six months prior to admission? : No Thoughts of Harm to Others: No Current Homicidal Intent: No Current Homicidal Plan: No Access to Homicidal Means: No Identified Victim: None History of harm to others?: No Assessment of Violence: None Noted Violent Behavior Description: None Does patient have access to weapons?: No Criminal Charges Pending?: No Does patient have a court date: No Is patient on probation?: No  Psychosis Hallucinations: None noted Delusions: None noted  Mental Status Report Appearance/Hygiene: In scrubs Eye Contact: Good Motor Activity: Freedom of movement, Unremarkable Speech: Logical/coherent Level of Consciousness: Alert Mood: Depressed Affect: Flat Anxiety Level: Minimal Thought Processes: Coherent, Relevant Judgement: Unimpaired Orientation: Person, Place, Time, Situation, Appropriate for developmental age Obsessive Compulsive Thoughts/Behaviors: None  Cognitive Functioning Concentration: Normal Memory: Remote Intact, Recent Intact Is patient IDD: No Insight: Good Impulse Control: Good Appetite: Poor Have you had any weight changes? : Loss Amount of the weight change? (lbs): 25 lbs Sleep: Decreased Total Hours of Sleep: 4(Pt reports her sleep is improved with Rx) Vegetative Symptoms: None  ADLScreening [facility] Assessment Services) Patient's cognitive ability adequate to safely complete daily activities?: Yes Patient able to express need for assistance with ADLs?: Yes Independently performs ADLs?: Yes (appropriate for developmental age)  Prior Inpatient Therapy Prior Inpatient Therapy: No  Prior Outpatient Therapy Prior  Outpatient Therapy: Yes Prior Therapy Dates: Current Prior Therapy Facilty/Provider(s): Outpatient Therapist Reason for Treatment: Depression Does patient have an ACCT team?: No Does patient have Intensive In-House Services?  : No Does patient have [facility] services? : No Does patient have P4CC services?: No  ADL Screening (condition at time of admission) Patient's cognitive ability adequate to safely complete daily activities?: Yes Patient able to express need for assistance with ADLs?: Yes Independently performs ADLs?: Yes (appropriate for developmental age)       Abuse/Neglect Assessment (Assessment to be complete while patient is alone) Abuse/Neglect Assessment Can Be Completed: Yes Physical Abuse: Denies Verbal Abuse: Denies Sexual Abuse: Denies Exploitation of patient/patient's resources: Denies Self-Neglect: Denies Values / Beliefs Cultural Requests During Hospitalization: None Spiritual Requests During Hospitalization: None Consults Spiritual Care Consult Needed: No Social Work Consult Needed: No Merchant navy officer (For Healthcare) Does Patient Have a Medical Advance Directive?: No Would patient like information on creating a medical advance directive?: No - Patient declined       Child/Adolescent Assessment Running Away Risk: (Patient is an adult)  Disposition:  Disposition Initial Assessment Completed for this Encounter: Yes Disposition of Patient: Admit Type of inpatient treatment program: Adult Patient refused recommended treatment: No Mode of transportation if patient is discharged/movement?: N/A Patient referred to: Other (Comment)([facility])  On Site Evaluation by:   Reviewed with Physician:    Wilmon Arms 11/04/2018 3:28 PM

## 2018-11-04 NOTE — ED Triage Notes (Signed)
Pt is here with [place] Research officer, political party and family with thoughts of SI.. pt states she has been fighting with her mental health for the past year. When asked what happened today the pt states "I woke up and I shouldn't have".Marland Kitchen

## 2018-11-04 NOTE — ED Provider Notes (Addendum)
[facility] EMERGENCY DEPARTMENT Provider Note   CSN: [id number] Arrival date & time: 11/04/18  4098    History   Chief Complaint Chief Complaint  Patient presents with  . Psychiatric Evaluation    HPI Traci Lester is a 58 y.o. female.     HPI 58 year old female here with depression.  Patient is tearful.  She reports that she has been struggling with depression for likely her whole life.  She has not seen psychiatric professionals but has a therapist.  She states that over the last 24 hours, she has had significant increase stressors.  She was told to read the book racing pain by her daughter.  She then became acutely aware of how her "words hurt others" and she felt very depressed.  She has a history of suicidal ideation of the past and began to feel suicidal.  She thought about overdosing.  She not actually take anything.  She states that she is here today because she woke up and did not want to be here, with active suicide ration.  She began driving her self to the ED, but pulled over and needed help.  No other complaints.  No drug use.  No alcohol use.  Past Medical History:  Diagnosis Date  . Allergy   . Fibromyalgia   . IBS (irritable bowel syndrome)   . PVC (premature ventricular contraction)   . Sjogren's syndrome [facility])     Patient Active Problem List   Diagnosis Date Noted  . Fibromyalgia 12/18/2016  . GERD (gastroesophageal reflux disease) 12/18/2016  . IBS (irritable bowel syndrome) 12/18/2016  . Gastroenteritis 07/03/2016  . Primary Sjogren's syndrome (HCC) 04/26/2011    Past Surgical History:  Procedure Laterality Date  . ABDOMINAL HYSTERECTOMY    . CESAREAN SECTION     times 3  . COLONOSCOPY WITH PROPOFOL N/A 01/15/2017   Procedure: COLONOSCOPY WITH PROPOFOL;  Surgeon: Christena Deem, MD;  Location: [facility] ENDOSCOPY;  Service: Endoscopy;  Laterality: N/A;  . KNEE SURGERY       OB History   No obstetric history on file.       Home Medications    Prior to Admission medications   Medication Sig Start Date End Date Taking? Authorizing Provider  ALPRAZolam Prudy Feeler) 0.25 MG tablet Take 0.25 mg by mouth at bedtime as needed for anxiety or sleep.    [provider]  amitriptyline (ELAVIL) 25 MG tablet Take 25 mg by mouth at bedtime.    [provider]  Calcium-Vitamin D-Vitamin K (VIACTIV PO) Take by mouth.    [provider]  conjugated estrogens (PREMARIN) vaginal cream Place 0.5 mg vaginally daily.    [provider]  DULoxetine (CYMBALTA) 30 MG capsule Take 30 mg by mouth daily.    [provider]  esomeprazole (NEXIUM) 40 MG capsule Take by mouth. 11/24/16 11/24/17  [provider]  ibuprofen (ADVIL,MOTRIN) 200 MG tablet Take 200 mg by mouth every 6 (six) hours as needed for fever, headache or moderate pain.    [provider]  Multiple Vitamin (MULTIVITAMIN WITH MINERALS) TABS tablet Take 1 tablet by mouth daily.    [provider]  omeprazole (PRILOSEC) 40 MG capsule Take 40 mg by mouth daily.    [provider]  pilocarpine (SALAGEN) 5 MG tablet Take by mouth 2 (two) times daily.    [provider]  pregabalin (LYRICA) 75 MG capsule Take 75 mg by mouth 2 (two) times daily.  [provider]  raloxifene (EVISTA) 60 MG tablet Take 60 mg by mouth daily.    [provider]  ranitidine (ZANTAC) 75 MG tablet Take 75 mg by mouth daily.     [provider]  zolpidem (AMBIEN) 10 MG tablet Take 10 mg by mouth at bedtime as needed for sleep.    [provider]    Family History Family History  Problem Relation Age of Onset  . Breast cancer Mother 2540  . Breast cancer Maternal Aunt 3240    Social History Social History   Tobacco Use  . Smoking status: Never Smoker  . Smokeless tobacco: Never Used  Substance Use Topics  . Alcohol use: Yes    Alcohol/week: 3.0 standard drinks    Types: 3  Standard drinks or equivalent per week  . Drug use: No     Allergies   Codeine; Duloxetine; Other; Tramadol; Wheat bran; and Sulfa antibiotics   Review of Systems Review of Systems  Constitutional: Negative for chills, fatigue and fever.  HENT: Negative for congestion and rhinorrhea.   Eyes: Negative for visual disturbance.  Respiratory: Negative for cough, shortness of breath and wheezing.   Cardiovascular: Negative for chest pain and leg swelling.  Gastrointestinal: Negative for abdominal pain, diarrhea, nausea and vomiting.  Genitourinary: Negative for dysuria and flank pain.  Musculoskeletal: Negative for neck pain and neck stiffness.  Skin: Negative for rash and wound.  Allergic/Immunologic: Negative for immunocompromised state.  Neurological: Negative for syncope, weakness and headaches.  Psychiatric/Behavioral: Positive for dysphoric mood, sleep disturbance and suicidal ideas. The patient is nervous/anxious.   All other systems reviewed and are negative.    Physical Exam Updated Vital Signs BP (!) 157/100 (BP Location: Left Arm)   Pulse 96   Resp 17   Ht 5\' 4"  (1.626 m)   Wt 53.1 kg   SpO2 100%   BMI 20.08 kg/m   Physical Exam Vitals signs and nursing note reviewed.  Constitutional:      General: She is not in acute distress.    Appearance: She is well-developed.  HENT:     Head: Normocephalic and atraumatic.  Eyes:     Conjunctiva/sclera: Conjunctivae normal.  Neck:     Musculoskeletal: Neck supple.  Cardiovascular:     Rate and Rhythm: Normal rate and regular rhythm.     Heart sounds: Normal heart sounds. No murmur. No friction rub.  Pulmonary:     Effort: Pulmonary effort is normal. No respiratory distress.     Breath sounds: Normal breath sounds. No wheezing or rales.  Abdominal:     General: There is no distension.     Palpations: Abdomen is soft.     Tenderness: There is no abdominal tenderness.  Skin:    General: Skin is warm.     Capillary  Refill: Capillary refill takes less than 2 seconds.  Neurological:     Mental Status: She is alert and oriented to person, place, and time.     Motor: No abnormal muscle tone.  Psychiatric:        Mood and Affect: Mood is depressed. Affect is blunt.        Behavior: Behavior is cooperative.      ED Treatments / Results  Labs (all labs ordered are listed, but only abnormal results are displayed) Labs Reviewed  COMPREHENSIVE METABOLIC PANEL - Abnormal; Notable for the following components:      Result Value   Glucose, Bld 102 (*)  All other components within normal limits  ACETAMINOPHEN LEVEL - Abnormal; Notable for the following components:   Acetaminophen (Tylenol), Serum <10 (*)    All other components within normal limits  URINE DRUG SCREEN, QUALITATIVE ([facility] ONLY) - Abnormal; Notable for the following components:   Tricyclic, Ur Screen POSITIVE (*)    Benzodiazepine, Ur Scrn POSITIVE (*)    All other components within normal limits  ETHANOL  SALICYLATE LEVEL  CBC    EKG EKG Interpretation  Date/Time:  Monday November 04 2018 10:33:15 EDT Ventricular Rate:  92 PR Interval:  126 QRS Duration: 102 QT Interval:  352 QTC Calculation: 435 R Axis:   92 Text Interpretation:  Normal sinus rhythm Rightward axis Borderline ECG When compared with ECG of 02-Jul-2016 22:12, PREVIOUS ECG IS PRESENT No significant change since last tracing Confirmed by Shaune Pollack [phone]) on 11/04/2018 10:34:27 AM   Radiology No results found.  Procedures Procedures (including critical care time)  Medications Ordered in ED Medications  acetaminophen (TYLENOL) tablet 650 mg (has no administration in time range)  ondansetron (ZOFRAN) tablet 4 mg (has no administration in time range)  alum & mag hydroxide-simeth (MAALOX/MYLANTA) 200-200-20 MG/5ML suspension 30 mL (has no administration in time range)  acetaminophen (TYLENOL) tablet 1,000 mg (has no administration in time range)  famotidine  (PEPCID) tablet 20 mg (has no administration in time range)  raloxifene (EVISTA) tablet 60 mg (has no administration in time range)  pregabalin (LYRICA) capsule 75 mg (has no administration in time range)  pilocarpine (SALAGEN) tablet 5 mg (has no administration in time range)  pantoprazole (PROTONIX) EC tablet 40 mg (has no administration in time range)  DULoxetine (CYMBALTA) DR capsule 30 mg (has no administration in time range)  amitriptyline (ELAVIL) tablet 25 mg (has no administration in time range)  ALPRAZolam (XANAX) tablet 0.25 mg (has no administration in time range)     Initial Impression / Assessment and Plan / ED Course  I have reviewed the triage vital signs and the nursing notes.  Pertinent labs & imaging results that were available during my care of the patient were reviewed by me and considered in my medical decision making (see chart for details).        58 yo F here severe depression and active suicidal ration.  She has multiple high risk features.  She is here voluntarily.  Will consult psychiatry.  She is medically clear.  Labs reviewed and unremarkable.  LFTs, electrolytes, CBC unremarkable.  Of note, pt has elavil at home. EKG normal, no signs of TCA o/d but this is a HIGH RISK medication and should likely be removed from the home.  Final Clinical Impressions(s) / ED Diagnoses   Final diagnoses:  Suicidal ideation    ED Discharge Orders    None       Shaune Pollack, MD 11/04/18 1030    Shaune Pollack, MD 11/04/18 1034

## 2018-11-04 NOTE — ED Notes (Signed)
Psychiatry is currently at her bedside  

## 2018-11-04 NOTE — Consult Note (Signed)
[facility] Face-to-Face Psychiatry Consult   Reason for Consult: Suicidal with plan to overdose. Referring Physician: Dr. Marcello Moores Patient Identification: Traci Lester MRN:  [id number] Principal Diagnosis: Suicidal ideation Diagnosis:  Principal Problem:   Suicidal ideation Active Problems:   Fibromyalgia   GERD (gastroesophageal reflux disease)   IBS (irritable bowel syndrome)   Primary Sjogren's syndrome (HCC)   MDD (major depressive disorder), recurrent severe, without psychosis (HCC)   Total Time spent with patient: 1 hour  Subjective: "My suicide thoughts are getting worse, and I thought I might do a drug overdose."  HPI:  Traci Lester is a 58 y.o. female patient here with depression.  Patient is tearful.  She reports that she has been struggling with depression for likely her whole life.  She has not seen psychiatric professionals but has a therapist.  She states that over the last 24 hours, she has had significant increase stressors.  She was told to read the book racing pain by her daughter.  She then became acutely aware of how her "words hurt others" and she felt very depressed.  She has a history of suicidal ideation of the past and began to feel suicidal.  She thought about overdosing.  She not actually take anything.  She states that she is here today because she woke up and did not want to be here, with active suicide ration.  She began driving her self to the ED, but pulled over and needed help.  No other complaints.  No drug use.  No alcohol use.   On evaluation, patient continues to be tearful.  She reports she has never had a psychiatrist, but had started therapy April 2019 after she had been started on Cymbalta for pain management and became so severely suicidal to the point she wrote a suicide letter.  Patient reports marital stress currently.  She states she has been on Wellbutrin XL 150 mg for 2 years, however this is no longer effective for managing her depression.   She has had this medication prescribed by her PCP, Dr. Judithann Sheen.  She notes that in the last few months, she has had weight loss from 142 pounds down 217 pounds.  She states she was started on metoprolol for rapid heart rate, which she now believes is due to her anxiety.  Patient describes she did start psychotherapy with Robby Sermon, and has a session every other week, but despite this she cannot shake her suicide thoughts.  She describes that she was intending to do an overdose, but chose to come to the hospital for help.  Patient does have guns in her home that are owned by her husband.  She reports that she feels safe at home, denies abuse, and states that she does not have access to the guns.  Patient does state she requires Xanax 0.5 mg and Ambien 10 mg every night to help her sleep.  Patient drinks 1 to 2 glasses of wine once or twice a month.  She denies other substance use history.  Patient continues to endorse SI.  She denies HI.  She denies AVH.  She has no history of self-harm.  She denies history of mania or psychosis.  Past Psychiatric History: Depression, anxiety, insomnia On Wellbutrin XL 150 mg for 2 years Cymbalta caused suicidal ideation and action.  Risk to Self:  Yes Risk to Others:  Denies Prior Inpatient Therapy:  None Prior Outpatient Therapy:  Yes, current with therapy with Patty Sprouse every other week.  Patient  has never been under the care of a psychiatrist.  Past Medical History:  Past Medical History:  Diagnosis Date  . Allergy   . Fibromyalgia   . IBS (irritable bowel syndrome)   . PVC (premature ventricular contraction)   . Sjogren's syndrome [facility])     Past Surgical History:  Procedure Laterality Date  . ABDOMINAL HYSTERECTOMY    . CESAREAN SECTION     times 3  . COLONOSCOPY WITH PROPOFOL N/A 01/15/2017   Procedure: COLONOSCOPY WITH PROPOFOL;  Surgeon: Christena Deem, MD;  Location: [facility] ENDOSCOPY;  Service: Endoscopy;  Laterality: N/A;  . KNEE SURGERY      Family History:  Family History  Problem Relation Age of Onset  . Breast cancer Mother 63  . Breast cancer Maternal Aunt 25   Family Psychiatric  History: Patient denies any mental illness or suicides in family.  Social History:  Social History   Substance and Sexual Activity  Alcohol Use Yes  . Alcohol/week: 3.0 standard drinks  . Types: 3 Standard drinks or equivalent per week     Social History   Substance and Sexual Activity  Drug Use No    Social History   Socioeconomic History  . Marital status: Married    Spouse name: Not on file  . Number of children: Not on file  . Years of education: Not on file  . Highest education level: Not on file  Occupational History  . Not on file  Social Needs  . Financial resource strain: Not on file  . Food insecurity:    Worry: Not on file    Inability: Not on file  . Transportation needs:    Medical: Not on file    Non-medical: Not on file  Tobacco Use  . Smoking status: Never Smoker  . Smokeless tobacco: Never Used  Substance and Sexual Activity  . Alcohol use: Yes    Alcohol/week: 3.0 standard drinks    Types: 3 Standard drinks or equivalent per week  . Drug use: No  . Sexual activity: Not on file  Lifestyle  . Physical activity:    Days per week: Not on file    Minutes per session: Not on file  . Stress: Not on file  Relationships  . Social connections:    Talks on phone: Not on file    Gets together: Not on file    Attends religious service: Not on file    Active member of club or organization: Not on file    Attends meetings of clubs or organizations: Not on file    Relationship status: Not on file  Other Topics Concern  . Not on file  Social History Narrative  . Not on file   Additional Social History:  Lives with husband, 3 adult children, her middle child's significant other and their child. She states that the majority of people have been home due to the COVID 19 quarantine, which has caused  increase stress.  Alcohol 1 to 2 glasses once or twice a week Caffeine 2 cups of coffee in the morning Denies nicotine use Denies illicit substance use.  Allergies:   Allergies  Allergen Reactions  . Codeine Other (See Comments)    Other Reaction: stomach pains Abdominal pain   . Duloxetine Other (See Comments)  . Other Diarrhea    Dairy containing products Wheat containing products  . Tramadol Hives  . Wheat Bran Diarrhea    Wheat and wheat containing products  . Sulfa Antibiotics  Labs:  Results for orders placed or performed during the hospital encounter of 11/04/18 (from the past 48 hour(s))  Comprehensive metabolic panel     Status: Abnormal   Collection Time: 11/04/18  8:40 AM  Result Value Ref Range   Sodium 137 135 - 145 mmol/L   Potassium 3.8 3.5 - 5.1 mmol/L   Chloride 105 98 - 111 mmol/L   CO2 23 22 - 32 mmol/L   Glucose, Bld 102 (H) 70 - 99 mg/dL   BUN 13 6 - 20 mg/dL   Creatinine, Ser 4.090.86 0.44 - 1.00 mg/dL   Calcium 9.5 8.9 - 81.110.3 mg/dL   Total Protein 7.9 6.5 - 8.1 g/dL   Albumin 4.5 3.5 - 5.0 g/dL   AST 21 15 - 41 U/L   ALT 16 0 - 44 U/L   Alkaline Phosphatase 47 38 - 126 U/L   Total Bilirubin 0.6 0.3 - 1.2 mg/dL   GFR calc non Af Amer >60 >60 mL/min   GFR calc Af Amer >60 >60 mL/min   Anion gap 9 5 - 15    Comment: Performed at [facility], [address] [place], [place] [address]  Ethanol     Status: None   Collection Time: 11/04/18  8:40 AM  Result Value Ref Range   Alcohol, Ethyl (B) <10 <10 mg/dL    Comment: (NOTE) Lowest detectable limit for serum alcohol is 10 mg/dL. For medical purposes only. Performed at [facility], [address] [place], [place] [address]   Salicylate level     Status: None   Collection Time: 11/04/18  8:40 AM  Result Value Ref Range   Salicylate Lvl <7.0 2.8 - 30.0 mg/dL    Comment: Performed at [facility], [address] [place], [place] [address]  Acetaminophen level      Status: Abnormal   Collection Time: 11/04/18  8:40 AM  Result Value Ref Range   Acetaminophen (Tylenol), Serum <10 (L) 10 - 30 ug/mL    Comment: (NOTE) Therapeutic concentrations vary significantly. A range of 10-30 ug/mL  may be an effective concentration for many patients. However, some  are best treated at concentrations outside of this range. Acetaminophen concentrations >150 ug/mL at 4 hours after ingestion  and >50 ug/mL at 12 hours after ingestion are often associated with  toxic reactions. Performed at [facility], [address] [place], [place] [address]   cbc     Status: None   Collection Time: 11/04/18  8:40 AM  Result Value Ref Range   WBC 5.8 4.0 - 10.5 K/uL   RBC 4.43 3.87 - 5.11 MIL/uL   Hemoglobin 14.0 12.0 - 15.0 g/dL   HCT 96.241.4 95.236.0 - 84.146.0 %   MCV 93.5 80.0 - 100.0 fL   MCH 31.6 26.0 - 34.0 pg   MCHC 33.8 30.0 - 36.0 g/dL   RDW 32.413.0 40.111.5 - 02.715.5 %   Platelets 194 150 - 400 K/uL   nRBC 0.0 0.0 - 0.2 %    Comment: Performed at [facility], [address] [place], [place] [address]  Urine Drug Screen, Qualitative     Status: Abnormal   Collection Time: 11/04/18  8:40 AM  Result Value Ref Range   Tricyclic, Ur Screen POSITIVE (A) NONE DETECTED   Amphetamines, Ur Screen NONE DETECTED NONE DETECTED   MDMA (Ecstasy)Ur Screen NONE DETECTED NONE DETECTED   Cocaine Metabolite,Ur [place] NONE DETECTED NONE DETECTED   Opiate, Ur Screen NONE  DETECTED NONE DETECTED   Phencyclidine (PCP) Ur S NONE DETECTED NONE DETECTED   Cannabinoid 50 Ng, Ur [place] NONE DETECTED NONE DETECTED   Barbiturates, Ur Screen NONE DETECTED NONE DETECTED   Benzodiazepine, Ur Scrn POSITIVE (A) NONE DETECTED   Methadone Scn, Ur NONE DETECTED NONE DETECTED    Comment: (NOTE) Tricyclics + metabolites, urine    Cutoff 1000 ng/mL Amphetamines + metabolites, urine  Cutoff 1000 ng/mL MDMA (Ecstasy), urine              Cutoff 500 ng/mL Cocaine Metabolite, urine          Cutoff 300  ng/mL Opiate + metabolites, urine        Cutoff 300 ng/mL Phencyclidine (PCP), urine         Cutoff 25 ng/mL Cannabinoid, urine                 Cutoff 50 ng/mL Barbiturates + metabolites, urine  Cutoff 200 ng/mL Benzodiazepine, urine              Cutoff 200 ng/mL Methadone, urine                   Cutoff 300 ng/mL The urine drug screen provides only a preliminary, unconfirmed analytical test result and should not be used for non-medical purposes. Clinical consideration and professional judgment should be applied to any positive drug screen result due to possible interfering substances. A more specific alternate chemical method must be used in order to obtain a confirmed analytical result. Gas chromatography / mass spectrometry (GC/MS) is the preferred confirmat ory method. Performed at [facility], [address], [place], [place] [address]     Current Facility-Administered Medications  Medication Dose Route Frequency Provider Last Rate Last Dose  . acetaminophen (TYLENOL) tablet 650 mg  650 mg Oral Q4H PRN Shaune Pollack, MD      . ALPRAZolam Prudy Feeler) tablet 0.5 mg  0.5 mg Oral QHS PRN Shaune Pollack, MD      . alum & mag hydroxide-simeth (MAALOX/MYLANTA) 200-200-20 MG/5ML suspension 30 mL  30 mL Oral Q6H PRN Shaune Pollack, MD      . buPROPion (WELLBUTRIN XL) 24 hr tablet 150 mg  150 mg Oral Daily Shaune Pollack, MD      . ibuprofen (ADVIL) tablet 200 mg  200 mg Oral Q6H PRN Mariel Craft, MD      . Melene Muller ON 11/05/2018] metoprolol succinate (TOPROL-XL) 24 hr tablet 25 mg  25 mg Oral Daily Shaune Pollack, MD      . ondansetron [facility]) tablet 4 mg  4 mg Oral Q8H PRN Shaune Pollack, MD      . progesterone (PROMETRIUM) capsule 100 mg  100 mg Oral Daily Shaune Pollack, MD      . zolpidem Remus Loffler) tablet 5 mg  5 mg Oral QHS PRN Mariel Craft, MD       Current Outpatient Medications  Medication Sig Dispense Refill  . ALPRAZolam (XANAX) 0.5 MG tablet Take 0.5 mg by  mouth at bedtime as needed for anxiety.    Marland Kitchen amitriptyline (ELAVIL) 25 MG tablet Take 25 mg by mouth at bedtime.    Marland Kitchen buPROPion (WELLBUTRIN XL) 150 MG 24 hr tablet Take 150 mg by mouth daily.    Marland Kitchen ibuprofen (ADVIL,MOTRIN) 200 MG tablet Take 200 mg by mouth every 6 (six) hours as needed for fever, headache or moderate pain.    . metoprolol succinate (TOPROL-XL) 100 MG 24 hr tablet Take 25  mg by mouth daily. Take with or immediately following a meal.    . Multiple Vitamin (MULTIVITAMIN WITH MINERALS) TABS tablet Take 1 tablet by mouth daily.    . progesterone (PROMETRIUM) 100 MG capsule Take 100 mg by mouth daily.    . raloxifene (EVISTA) 60 MG tablet Take 60 mg by mouth daily.    Marland Kitchen zolpidem (AMBIEN) 10 MG tablet Take 10 mg by mouth at bedtime as needed for sleep.    . Calcium-Vitamin D-Vitamin K (VIACTIV PO) Take by mouth.    . conjugated estrogens (PREMARIN) vaginal cream Place 0.5 mg vaginally daily.    Marland Kitchen esomeprazole (NEXIUM) 40 MG capsule Take by mouth.      Musculoskeletal: Strength & Muscle Tone: within normal limits Gait & Station: normal Patient leans: N/A  Psychiatric Specialty Exam: Physical Exam  Vitals reviewed. Constitutional: She is oriented to person, place, and time. She appears well-nourished. She appears distressed.  HENT:  Head: Normocephalic and atraumatic.  Eyes: EOM are normal.  Neck: Normal range of motion.  Cardiovascular: Normal rate and regular rhythm.  Respiratory: Effort normal. No respiratory distress.  Musculoskeletal: Normal range of motion.  Neurological: She is alert and oriented to person, place, and time.    Review of Systems  Constitutional: Positive for malaise/fatigue and weight loss.  HENT:       Dry mouth and lips  Eyes: Positive for pain.  Respiratory: Negative.   Cardiovascular: Negative.   Gastrointestinal: Negative.   Musculoskeletal: Positive for joint pain and myalgias.  Skin: Positive for itching.  Neurological: Negative.    Psychiatric/Behavioral: Positive for depression and suicidal ideas. Negative for hallucinations, memory loss and substance abuse. The patient is nervous/anxious and has insomnia.     Blood pressure 130/81, pulse 88, temperature 98 F (36.7 C), temperature source Oral, resp. rate 17, height  (1.626 m), weight 53.1 kg, SpO2 99 %.Body mass index is 20.08 kg/m.  General Appearance: Casual  Eye Contact:  Fair  Speech:  Clear and Coherent and Normal Rate  Volume:  Decreased  Mood:  Anxious, Depressed, Hopeless and Worthless  Affect:  Blunt  Thought Process:  Coherent  Orientation:  Full (Time, Place, and Person)  Thought Content:  Logical and Hallucinations: None  Suicidal Thoughts:  Yes.  with intent/plan  Homicidal Thoughts:  No  Memory:  Good  Judgement:  Fair  Insight:  Present  Psychomotor Activity:  Normal  Concentration:  Concentration: Fair  Recall:  Good  Fund of Knowledge:  Good  Language:  Good  Akathisia:  No  Handed:  Right  AIMS (if indicated):     Assets:  Communication Skills Desire for Improvement Financial Resources/Insurance Housing Social Support  ADL's:  Intact  Cognition:  WNL  Sleep:   Poor     Treatment Plan Summary: Daily contact with patient to assess and evaluate symptoms and progress in treatment and Medication management  Restart home medications to include Wellbutrin XL 150 mg daily; alprazolam 0.5 mg at bedtime; decrease Ambien to 5 mg at bedtime. Further medication management deferred to psychiatry inpatient treatment team.  COVID-19 screening completed.  Disposition: Recommend psychiatric Inpatient admission when medically cleared. Supportive therapy provided about ongoing stressors.  Admission orders placed.  Mariel Craft, MD 11/04/2018 2:35 PM

## 2018-11-04 NOTE — ED Notes (Signed)
ED  Is the patient under IVC or is there intent for IVC:  voluntary Is the patient medically cleared: Yes.   Is there vacancy in the ED BHU: Yes.   Is the population mix appropriate for patient: Yes.   Is the patient awaiting placement in inpatient or outpatient setting:   Has the patient had a psychiatric consult:  Consult pending  Survey of unit performed for contraband, proper placement and condition of furniture, tampering with fixtures in bathroom, shower, and each patient room: Yes.  ; Findings:  APPEARANCE/BEHAVIOR Calm and cooperative NEURO ASSESSMENT Orientation: oriented x3   Hallucinations: No.None noted (Hallucinations)  denies Speech: Normal Gait: normal RESPIRATORY ASSESSMENT Even  Unlabored respirations  CARDIOVASCULAR ASSESSMENT Pulses equal   regular rate  Skin warm and dry   GASTROINTESTINAL ASSESSMENT no GI complaint EXTREMITIES Full ROM  PLAN OF CARE Provide calm/safe environment. Vital signs assessed twice daily. ED BHU Assessment once each 12-hour shift. Collaborate with TTS if available or as condition indicates. Assure the ED provider has rounded once each shift. Provide and encourage hygiene. Provide redirection as needed. Assess for escalating behavior; address immediately and inform ED provider.  Assess family dynamic and appropriateness for visitation as needed: Yes.  ; If necessary, describe findings:  Educate the patient/family about BHU procedures/visitation: Yes.  ; If necessary, describe findings:

## 2018-11-04 NOTE — ED Notes (Signed)
PT VOL/ PENDING  GOING  TO  [facility] TONIGHT 

## 2018-11-05 DIAGNOSIS — F332 Major depressive disorder, recurrent severe without psychotic features: Principal | ICD-10-CM

## 2018-11-05 MED ORDER — POLYVINYL ALCOHOL 1.4 % OP SOLN
1.0000 [drp] | OPHTHALMIC | Status: DC | PRN
  Administered 2018-11-05: 1 [drp] via OPHTHALMIC
  Filled 2018-11-05: qty 15

## 2018-11-05 MED ORDER — METOPROLOL SUCCINATE ER 25 MG PO TB24
12.5000 mg | ORAL_TABLET | Freq: Every day | ORAL | Status: DC
  Administered 2018-11-05 – 2018-11-06 (×2): 12.5 mg via ORAL
  Filled 2018-11-05 (×2): qty 1

## 2018-11-05 MED ORDER — IBUPROFEN 600 MG PO TABS: 600.0000 mg | ORAL_TABLET | Freq: Four times a day (QID) | ORAL | Status: DC | PRN

## 2018-11-05 MED ORDER — BUPROPION HCL ER (XL) 150 MG PO TB24
300.0000 mg | ORAL_TABLET | Freq: Every day | ORAL | Status: DC
  Administered 2018-11-06: 300 mg via ORAL
  Filled 2018-11-05: qty 2

## 2018-11-05 MED ORDER — ALPRAZOLAM 0.5 MG PO TABS
0.5000 mg | ORAL_TABLET | Freq: Every day | ORAL | Status: DC
  Administered 2018-11-05: 0.5 mg via ORAL
  Filled 2018-11-05: qty 1

## 2018-11-05 MED ORDER — ZOLPIDEM TARTRATE 5 MG PO TABS
10.0000 mg | ORAL_TABLET | Freq: Every evening | ORAL | Status: DC | PRN
  Administered 2018-11-05: 10 mg via ORAL
  Filled 2018-11-05: qty 2

## 2018-11-05 MED ORDER — NON FORMULARY: 30.0000 mg | Freq: Three times a day (TID) | Status: DC

## 2018-11-05 NOTE — Plan of Care (Signed)
Pt rates depression 2/10 and anxiety 8/10. Pt states she slept "really bad". Pt denies SI, HI and AVH. Pt has a goal to "talk to the doctor" referring to medication regimen. Pt was educated on care plan and verbalizes understanding, Torrie Mayers RN Problem: Education: Goal: Utilization of techniques to improve thought processes will improve Outcome: Not Progressing Goal: Knowledge of the prescribed therapeutic regimen will improve Outcome: Progressing   Problem: Activity: Goal: Interest or engagement in leisure activities will improve Outcome: Progressing Goal: Imbalance in normal sleep/wake cycle will improve Outcome: Not Progressing   Problem: Coping: Goal: Coping ability will improve Outcome: Not Progressing Goal: Will verbalize feelings Outcome: Progressing   Problem: Health Behavior/Discharge Planning: Goal: Ability to make decisions will improve Outcome: Progressing Goal: Compliance with therapeutic regimen will improve Outcome: Progressing   Problem: Education: Goal: Ability to make informed decisions regarding treatment will improve Outcome: Progressing   Problem: Medication: Goal: Compliance with prescribed medication regimen will improve Outcome: Progressing   Problem: Self-Concept: Goal: Ability to disclose and discuss suicidal ideas will improve Outcome: Progressing Goal: Will verbalize positive feelings about self Outcome: Progressing   Problem: Education: Goal: Ability to state activities that reduce stress will improve Outcome: Progressing   Problem: Coping: Goal: Ability to identify and develop effective coping behavior will improve Outcome: Progressing   Problem: Self-Concept: Goal: Ability to identify factors that promote anxiety will improve Outcome: Progressing Goal: Level of anxiety will decrease Outcome: Not Progressing Goal: Ability to modify response to factors that promote anxiety will improve Outcome: Progressing

## 2018-11-05 NOTE — BHH Suicide Risk Assessment (Signed)
[facility] Admission Suicide Risk Assessment   Nursing information obtained from:  Patient Demographic factors:  NA Current Mental Status:  NA Loss Factors:  NA Historical Factors:  NA Risk Reduction Factors:  NA  Total Time spent with patient: 1 hour Principal Problem: MDD (major depressive disorder), recurrent severe, without psychosis (HCC) Diagnosis:  Principal Problem:   MDD (major depressive disorder), recurrent severe, without psychosis (HCC) Active Problems:   Fibromyalgia   GERD (gastroesophageal reflux disease)   IBS (irritable bowel syndrome)   Primary Sjogren's syndrome (HCC)  Subjective Data: Patient denies any suicidal or homicidal ideation.  Denies any wish to die.  Denies having done anything to try to harm her self.  Patient admits to chronic depression and anxiety with some worsening recently because of family stress.  No new physical complaints.  Has chronic fibromyalgia.  Continued Clinical Symptoms:  Alcohol Use Disorder Identification Test Final Score (AUDIT): 7 The "Alcohol Use Disorders Identification Test", Guidelines for Use in Primary Care, Second Edition.  World Science writer [facility] [facility]). Score between 0-7:  no or low risk or alcohol related problems. Score between 8-15:  moderate risk of alcohol related problems. Score between 16-19:  high risk of alcohol related problems. Score 20 or above:  warrants further diagnostic evaluation for alcohol dependence and treatment.   CLINICAL FACTORS:   Depression:   Anhedonia   Musculoskeletal: Strength & Muscle Tone: within normal limits Gait & Station: normal Patient leans: N/A  Psychiatric Specialty Exam: Physical Exam  Nursing note and vitals reviewed. Constitutional: She appears well-developed and well-nourished.  HENT:  Head: Normocephalic and atraumatic.  Eyes: Pupils are equal, round, and reactive to light. Conjunctivae are normal.  Neck: Normal range of motion.  Cardiovascular: Regular rhythm and  normal heart sounds.  Respiratory: Effort normal. No respiratory distress.  GI: Soft.  Musculoskeletal: Normal range of motion.  Neurological: She is alert.  Skin: Skin is warm and dry.  Psychiatric: Her speech is normal and behavior is normal. Judgment and thought content normal. Her mood appears anxious. Cognition and memory are normal.    Review of Systems  Constitutional: Negative.   HENT: Negative.   Eyes: Negative.   Respiratory: Negative.   Cardiovascular: Negative.   Gastrointestinal: Negative.   Musculoskeletal: Negative.   Skin: Negative.   Neurological: Negative.   Psychiatric/Behavioral: Positive for depression. Negative for hallucinations, memory loss, substance abuse and suicidal ideas. The patient is nervous/anxious and has insomnia.     Blood pressure 115/77, pulse 95, temperature 98.6 F (37 C), temperature source Oral, resp. rate 16, height 5\' 8"  (1.727 m), weight 53.1 kg, SpO2 100 %.Body mass index is 17.79 kg/m.  General Appearance: Casual  Eye Contact:  Good  Speech:  Normal Rate  Volume:  Normal  Mood:  Euthymic  Affect:  Congruent  Thought Process:  Goal Directed  Orientation:  Full (Time, Place, and Person)  Thought Content:  Logical  Suicidal Thoughts:  No  Homicidal Thoughts:  No  Memory:  Immediate;   Fair Recent;   Fair Remote;   Fair  Judgement:  Fair  Insight:  Fair  Psychomotor Activity:  Normal  Concentration:  Concentration: Fair  Recall:  [employer] of Knowledge:  Fair  Language:  Fair  Akathisia:  No  Handed:  Right  AIMS (if indicated):     Assets:  Desire for Improvement Housing  ADL's:  Intact  Cognition:  WNL  Sleep:  Number of Hours: 4.25      COGNITIVE  FEATURES THAT CONTRIBUTE TO RISK:  Thought constriction (tunnel vision)    SUICIDE RISK:   Minimal: No identifiable suicidal ideation.  Patients presenting with no risk factors but with morbid ruminations; may be classified as minimal risk based on the severity of the  depressive symptoms  PLAN OF CARE: Patient seen.  Continue medication except for increasing the dose of her Wellbutrin.  At least 1 of her medicines that she takes regularly is nonformulary and we will not be able to get it for her.  Otherwise continue outpatient medicines.  Patient at this point does not appear to be acutely dangerous.  Has not been showing behavior problems and is likely to be discharged tomorrow with referral to appropriate outpatient follow-up  I certify that inpatient services furnished can reasonably be expected to improve the patient's condition.   Mordecai RasmussenJohn Clapacs, MD 11/05/2018, 5:32 PM

## 2018-11-05 NOTE — Progress Notes (Signed)
Recreation Therapy Notes  Date: 11/05/2018  Time: 9:30 am   Location: Craft room   Behavioral response: N/A   Intervention Topic: Anger Management  Discussion/Intervention: Patient did not attend group.   Clinical Observations/Feedback:  Patient did not attend group.   Kristel Durkee LRT/CTRS        Merion Caton 11/05/2018 10:38 AM

## 2018-11-05 NOTE — Progress Notes (Signed)
Patient is a new admit from ED, 58 year old voluntarily committed for severe depression and anxiety , patient expressed depressions for the last 2 years and every thing just boiled over and thought of over  Dosing with prescription drugs, patient denies taking any thing , and contract for safety of self and others , body search and skin check done by two nurses and no contraband found , skin is clean and intact. Room and unit orientation is complete , patient denies any SI/HI/AVH at this time, hygiene products are provided patient is calm and cooperative and appropriate and has no physical complains at this time.

## 2018-11-05 NOTE — [facility] Group Notes (Signed)
LCSW Group Therapy Note  11/05/2018 1:00 PM  Type of Therapy/Topic:  Group Therapy:  Feelings about Diagnosis  Participation Level:  Did Not Attend   Description of Group:   This group will allow patients to explore their thoughts and feelings about diagnoses they have received. Patients will be guided to explore their level of understanding and acceptance of these diagnoses. Facilitator will encourage patients to process their thoughts and feelings about the reactions of others to their diagnosis and will guide patients in identifying ways to discuss their diagnosis with significant others in their lives. This group will be process-oriented, with patients participating in exploration of their own experiences, giving and receiving support, and processing challenge from other group members.   Therapeutic Goals: 1. Patient will demonstrate understanding of diagnosis as evidenced by identifying two or more symptoms of the disorder 2. Patient will be able to express two feelings regarding the diagnosis 3. Patient will demonstrate their ability to communicate their needs through discussion and/or role play  Summary of Patient Progress: X  Therapeutic Modalities:   Cognitive Behavioral Therapy Brief Therapy Feelings Identification   Penni Homans, MSW, LCSW 11/05/2018 2:27 PM

## 2018-11-05 NOTE — Tx Team (Signed)
Initial Treatment Plan 11/05/2018 12:15 AM Traci Lester URK:[id number]    PATIENT STRESSORS: Financial difficulties Marital or family conflict Occupational concerns   PATIENT STRENGTHS: Capable of independent living Motivation for treatment/growth Supportive family/friends   PATIENT IDENTIFIED PROBLEMS: Mental breakdown    Depressions/ Anxiety    Financial Issues    Family problems         DISCHARGE CRITERIA:  Adequate post-discharge living arrangements Improved stabilization in mood, thinking, and/or behavior Medical problems require only outpatient monitoring Motivation to continue treatment in a less acute level of care Need for constant or close observation no longer present  PRELIMINARY DISCHARGE PLAN: Attend 12-step recovery group Participate in family therapy Return to previous living arrangement  PATIENT/FAMILY INVOLVEMENT: This treatment plan has been presented to and reviewed with the patient, Traci Lester,  The patient  have been given the opportunity to ask questions and make suggestions.  Lelan Pons, RN 11/05/2018, 12:15 AM

## 2018-11-05 NOTE — [facility] Counselor (Signed)
Adult Comprehensive Assessment  Patient ID: Traci Lester, female   DOB: 1960-06-09, 58 y.o.   MRN: [id number]  Information Source: Information source: Patient  Current Stressors:  Patient states their primary concerns and needs for treatment are:: Patient reports " figured I've suffered from depression for years.  It has gotten worse with the diagnosis of multiple diseases with no pain control.  I didn't realize it affects mood and ended up writing a suicide note." Patient states their goals for this hospitilization and ongoing recovery are:: Pt reports "pain and depression management".  Family Relationships: Pt reports "my husband and I have ups and downs".  Financial / Lack of resources (include bankruptcy): Pt reports "Covid kicked everyone out of a job". Physical health (include injuries & life threatening diseases): Pt reports that she has the following: "fibromyalgia, IBS, neuropathy and Sjogren's syndrome". Bereavement / Loss: Pt rpeorts "I lost my dad 2 years ago".  Living/Environment/Situation:  Living Arrangements: Spouse/significant other, Children, Other relatives Living conditions (as described by patient or guardian): Pt reports "okay". Who else lives in the home?: Pt lives with husband, three adult children, boyfriend of adult child and granddaughter. How long has patient lived in current situation?: Pt rpeorts "13 years". What is atmosphere in current home: Comfortable, Loving  Family History:  Marital status: Married Number of Years Married: 27 What types of issues is patient dealing with in the relationship?: Pt rpeorts "sexual issues and communication issues, with me feeling heard". Pt reports "sexual issues refers to he wants it more than I do".  Are you sexually active?: Yes What is your sexual orientation?: Heterosexual. Has your sexual activity been affected by drugs, alcohol, medication, or emotional stress?: Pt reports "yes". Does patient have children?:  Yes How many children?: 3 How is patient's relationship with their children?: Pt reports that she has 3 children (25, 24, 22).  Pt reports relationship is "good".   Childhood History:  By whom was/is the patient raised?: Both parents Additional childhood history information: Pt reports mother passed away when pt was [id number]. Description of patient's relationship with caregiver when they were a child: Pt reports "good". Patient's description of current relationship with people who raised him/her: Pt reports both parents are deceased. How were you disciplined when you got in trouble as a child/adolescent?: Pt reports "mostly groundings and time outs". Does patient have siblings?: Yes Number of Siblings: 1 Description of patient's current relationship with siblings: Pt reports "good".  Did patient suffer any verbal/emotional/physical/sexual abuse as a child?: No Did patient suffer from severe childhood neglect?: No Has patient ever been sexually abused/assaulted/raped as an adolescent or adult?: No Was the patient ever a victim of a crime or a disaster?: No Witnessed domestic violence?: No Has patient been effected by domestic violence as an adult?: No  Education:  Highest grade of school patient has completed: Pt reports that she has her BS in Occupational Therapy. Currently a student?: No Learning disability?: No  Employment/Work Situation:   Employment situation: On disability Why is patient on disability: Pt rreports "Sjogren's Syndrome and Fibromyalgia". How long has patient been on disability: Pt rpeorts "15 years". Patient's job has been impacted by current illness: No What is the longest time patient has a held a job?: Pt rpoerts "multiple years".  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 Did You Receive Any Psychiatric Treatment/Services While in the Military?: No(NA) Are There Guns or Other Weapons in Your Home?: Yes Types of Guns/Weapons:  Pt reports  several guns. Are These Weapons Safely Secured?: Yes  Financial Resources:   Financial resources: Receives SSDI, [employer], Income from spouse Does patient have a representative payee or guardian?: No  Alcohol/Substance Abuse:   What has been your use of drugs/alcohol within the last 12 months?: Pt denies. If attempted suicide, did drugs/alcohol play a role in this?: No Alcohol/Substance Abuse Treatment Hx: Denies past history Has alcohol/substance abuse ever caused legal problems?: No  Social Support System:   Patient's Community Support System: Good Describe Community Support System: Pt reports "my kids and husband".  Type of faith/religion: Pt reports "I'm a [place]". How does patient's faith help to cope with current illness?: Pt reports "not as effectively as I should".   Leisure/Recreation:   Leisure and Hobbies: Pt reports "I don't have any.  I am a voracious reader if I can do it.  We have 4 acres and chickens and a dog.  Like living on a farm".   Strengths/Needs:   What is the patient's perception of their strengths?: Pt reports "I guess I am independent.  I am highly sensitive and empathetic." Patient states they can use these personal strengths during their treatment to contribute to their recovery: Pt reports "I don't know." Patient states these barriers may affect/interfere with their treatment: Pt denies. Patient states these barriers may affect their return to the community: Pt denies.  Discharge Plan:   Currently receiving community mental health services: Yes (From Whom)(Pt reports that she sees Chief Executive Officer [facility]. ) Patient states concerns and preferences for aftercare planning are: Pt reports that she would like to continue with her therapist.  Pt is open to looking for a psychiatrist.  Patient states they will know when they are safe and ready for discharge when: Pt reports "I am ready for dishcarge now." Does patient have access to  transportation?: Yes Does patient have financial barriers related to discharge medications?: No Will patient be returning to same living situation after discharge?: Yes  Summary/Recommendations:   Summary and Recommendations (to be completed by the evaluator): Pt is a 59 year old married female [place] [facility]  Patient reports that she receives disability for her medical conditions. She reports that she has [employer].  She presents to the hospital following concerns of increased depression and passive suicidal ideation.  Patient reports that she wrote a suicide note.  She has a primary diagnosis of Major Depressive Disorder, recurrent severe, without psychosis.  Recommendations include crisis stabilization, therapeutic milieu, encourage group attendance and participation, medication management for detox/mood stabilization and development of comprehensive mental wellness/sobriety plan.  Harden Mo. 11/05/2018

## 2018-11-05 NOTE — [facility] Suicide Risk Assessment (Signed)
[facility] INPATIENT:  Family/Significant Other Suicide Prevention Education  Suicide Prevention Education:  Education Completed; Valda Hagin, husband, [phone] has been identified by the patient as the family member/significant other with whom the patient will be residing, and identified as the person(s) who will aid the patient in the event of a mental health crisis (suicidal ideations/suicide attempt).  With written consent from the patient, the family member/significant other has been provided the following suicide prevention education, prior to the and/or following the discharge of the patient.  The suicide prevention education provided includes the following:  Suicide risk factors  Suicide prevention and interventions  National Suicide Hotline telephone number  [facility] assessment telephone number  [facility] Emergency Assistance 911  [facility] and/or Residential Mobile Crisis Unit telephone number  Request made of family/significant other to:  Remove weapons (e.g., guns, rifles, knives), all items previously/currently identified as safety concern.    Remove drugs/medications (over-the-counter, prescriptions, illicit drugs), all items previously/currently identified as a safety concern.  The family member/significant other verbalizes understanding of the suicide prevention education information provided.  The family member/significant other agrees to remove the items of safety concern listed above.  Husband reports that the patient read "[employer]" and as a result she has begun to place blame on herself for all the things that have gone wrong in our relationship and with the children.  Husband reports "I don't know how to help her or what to do."  Husband reports "I think that I am the majority of her problems.  She told one of the kids that we were toxic to one another.  Maybe we should separate."  Husband reports that this was the second suicide note  that she has written, the one previous was in 2019.  Husband reports that "she told someone that stopped on the side of the road to help her that she wanted to hang herself".  Husband reports that the police brought the patient to the hospital.  CSW reviewed with the husband weapons in the home and he reports that they are locked at this time.  CSW also reviewed pt's list of requested items with the patient.   Harden Mo 11/05/2018, 10:41 AM

## 2018-11-05 NOTE — H&P (Signed)
Psychiatric Admission Assessment Adult  Patient Identification: Traci Lester MRN:  [id number] Date of Evaluation:  11/05/2018 Chief Complaint:  MDD Principal Diagnosis: MDD (major depressive disorder), recurrent severe, without psychosis (HCC) Diagnosis:  Principal Problem:   MDD (major depressive disorder), recurrent severe, without psychosis (HCC) Active Problems:   Fibromyalgia   GERD (gastroesophageal reflux disease)   IBS (irritable bowel syndrome)   Primary Sjogren's syndrome (HCC)  History of Present Illness: Patient seen and chart reviewed.  58 year old woman brought to the hospital because of worsening depression.  Patient became extremely upset on waking up 1 day this weekend.  Had some panicky symptoms feeling overwhelmed with guilt.  Had not been able to sleep well the night before.  Had some passing suicidal thoughts without specific intent or plan.  Patient reports that she has been dealing with depression for years in addition to her chronic pain and illness.  She has some chronic stress from those and also slightly more acute stress from conflicts she is having with her husband.  They are not separated but are thinking of getting into marital therapy.  On interview today the patient says she is not having any actual suicidal thoughts.  No intention or plan to harm her self.  No thoughts of hurting anyone else.  Denies any psychotic symptoms.  She is currently on Wellbutrin extended release 150 mg/day.  Has been on the same medicine for a couple of years.  In the past tried Cymbalta but after just a week or so found that it was causing suicidal ideation.  Patient does see a therapist regularly.  She admits that she will have vague thoughts of wishing to not be here or vague suicidal thoughts without any formed image of killing herself but insists that she is not a danger to herself.  She says she drinks alcohol only occasionally minimizing the total amount.  Denies alcohol or drug  abuse. Associated Signs/Symptoms: Depression Symptoms:  depressed mood, anhedonia, insomnia, difficulty concentrating, hopelessness, suicidal thoughts without plan, loss of energy/fatigue, (Hypo) Manic Symptoms:  None reported Anxiety Symptoms:  Excessive Worry, Psychotic Symptoms:  None reported PTSD Symptoms: Negative Total Time spent with patient: 1 hour  Past Psychiatric History: Patient has had no previous hospitalizations.  No previous suicide attempts.  No history of violence to others no history of psychosis.  Currently seeing a therapist regularly.  Seeing her primary care doctor for medication management.  Is the patient at risk to self? No.  Has the patient been a risk to self in the past 6 months? No.  Has the patient been a risk to self within the distant past? No.  Is the patient a risk to others? No.  Has the patient been a risk to others in the past 6 months? No.  Has the patient been a risk to others within the distant past? No.   Prior Inpatient Therapy:   Prior Outpatient Therapy:    Alcohol Screening: 1. How often do you have a drink containing alcohol?: Monthly or less 2. How many drinks containing alcohol do you have on a typical day when you are drinking?: 1 or 2 3. How often do you have six or more drinks on one occasion?: Less than monthly AUDIT-C Score: 2 4. How often during the last year have you found that you were not able to stop drinking once you had started?: Less than monthly 5. How often during the last year have you failed to do what was normally expected  from you becasue of drinking?: Less than monthly 6. How often during the last year have you needed a first drink in the morning to get yourself going after a heavy drinking session?: Less than monthly 7. How often during the last year have you had a feeling of guilt of remorse after drinking?: Less than monthly 8. How often during the last year have you been unable to remember what happened the  night before because you had been drinking?: Less than monthly 9. Have you or someone else been injured as a result of your drinking?: No 10. Has a relative or friend or a doctor or another health worker been concerned about your drinking or suggested you cut down?: No Alcohol Use Disorder Identification Test Final Score (AUDIT): 7 Alcohol Brief Interventions/Follow-up: Alcohol Education Substance Abuse History in the last 12 months:  No. Consequences of Substance Abuse: Negative Previous Psychotropic Medications: Yes  Psychological Evaluations: Yes  Past Medical History:  Past Medical History:  Diagnosis Date  . Allergy   . Fibromyalgia   . IBS (irritable bowel syndrome)   . PVC (premature ventricular contraction)   . Sjogren's syndrome [facility])     Past Surgical History:  Procedure Laterality Date  . ABDOMINAL HYSTERECTOMY    . CESAREAN SECTION     times 3  . COLONOSCOPY WITH PROPOFOL N/A 01/15/2017   Procedure: COLONOSCOPY WITH PROPOFOL;  Surgeon: Christena Deem, MD;  Location: [facility] ENDOSCOPY;  Service: Endoscopy;  Laterality: N/A;  . KNEE SURGERY     Family History:  Family History  Problem Relation Age of Onset  . Breast cancer Mother 48  . Breast cancer Maternal Aunt 18   Family Psychiatric  History: Denies any family history Tobacco Screening: Have you used any form of tobacco in the last 30 days? (Cigarettes, Smokeless Tobacco, Cigars, and/or Pipes): No Social History:  Social History   Substance and Sexual Activity  Alcohol Use Yes  . Alcohol/week: 3.0 standard drinks  . Types: 3 Standard drinks or equivalent per week     Social History   Substance and Sexual Activity  Drug Use No    Additional Social History: Marital status: Married Number of Years Married: 27 What types of issues is patient dealing with in the relationship?: Pt rpeorts "sexual issues and communication issues, with me feeling heard". Pt reports "sexual issues refers to he wants it more  than I do".  Are you sexually active?: Yes What is your sexual orientation?: Heterosexual. Has your sexual activity been affected by drugs, alcohol, medication, or emotional stress?: Pt reports "yes". Does patient have children?: Yes How many children?: 3 How is patient's relationship with their children?: Pt reports that she has 3 children (25, 24, 22).  Pt reports relationship is "good".                          Allergies:   Allergies  Allergen Reactions  . Codeine Other (See Comments)    Other Reaction: stomach pains Abdominal pain   . Duloxetine Other (See Comments)  . Other Diarrhea    Dairy containing products Wheat containing products  . Tramadol Hives  . Wheat Bran Diarrhea    Wheat and wheat containing products  . Sulfa Antibiotics    Lab Results:  Results for orders placed or performed during the hospital encounter of 11/04/18 (from the past 48 hour(s))  Comprehensive metabolic panel     Status: Abnormal   Collection Time: 11/04/18  8:40 AM  Result Value Ref Range   Sodium 137 135 - 145 mmol/L   Potassium 3.8 3.5 - 5.1 mmol/L   Chloride 105 98 - 111 mmol/L   CO2 23 22 - 32 mmol/L   Glucose, Bld 102 (H) 70 - 99 mg/dL   BUN 13 6 - 20 mg/dL   Creatinine, Ser 2.13 0.44 - 1.00 mg/dL   Calcium 9.5 8.9 - 08.6 mg/dL   Total Protein 7.9 6.5 - 8.1 g/dL   Albumin 4.5 3.5 - 5.0 g/dL   AST 21 15 - 41 U/L   ALT 16 0 - 44 U/L   Alkaline Phosphatase 47 38 - 126 U/L   Total Bilirubin 0.6 0.3 - 1.2 mg/dL   GFR calc non Af Amer >60 >60 mL/min   GFR calc Af Amer >60 >60 mL/min   Anion gap 9 5 - 15    Comment: Performed at [facility], [address] [place], [place] [address]  Ethanol     Status: None   Collection Time: 11/04/18  8:40 AM  Result Value Ref Range   Alcohol, Ethyl (B) <10 <10 mg/dL    Comment: (NOTE) Lowest detectable limit for serum alcohol is 10 mg/dL. For medical purposes only. Performed at [facility], [address] [place], [place] [address]   Salicylate level     Status: None   Collection Time: 11/04/18  8:40 AM  Result Value Ref Range   Salicylate Lvl <7.0 2.8 - 30.0 mg/dL    Comment: Performed at [facility], [address] [place], [place] [address]  Acetaminophen level     Status: Abnormal   Collection Time: 11/04/18  8:40 AM  Result Value Ref Range   Acetaminophen (Tylenol), Serum <10 (L) 10 - 30 ug/mL    Comment: (NOTE) Therapeutic concentrations vary significantly. A range of 10-30 ug/mL  may be an effective concentration for many patients. However, some  are best treated at concentrations outside of this range. Acetaminophen concentrations >150 ug/mL at 4 hours after ingestion  and >50 ug/mL at 12 hours after ingestion are often associated with  toxic reactions. Performed at [facility], [address] [place], [place] [address]   cbc     Status: None   Collection Time: 11/04/18  8:40 AM  Result Value Ref Range   WBC 5.8 4.0 - 10.5 K/uL   RBC 4.43 3.87 - 5.11 MIL/uL   Hemoglobin 14.0 12.0 - 15.0 g/dL   HCT 02.7 25.3 - 66.4 %   MCV 93.5 80.0 - 100.0 fL   MCH 31.6 26.0 - 34.0 pg   MCHC 33.8 30.0 - 36.0 g/dL   RDW 40.3 47.4 - 25.9 %   Platelets 194 150 - 400 K/uL   nRBC 0.0 0.0 - 0.2 %    Comment: Performed at [facility], [address] [place], [place] [address]  Urine Drug Screen, Qualitative     Status: Abnormal   Collection Time: 11/04/18  8:40 AM  Result Value Ref Range   Tricyclic, Ur Screen POSITIVE (A) NONE DETECTED   Amphetamines, Ur Screen NONE DETECTED NONE DETECTED   MDMA (Ecstasy)Ur Screen NONE DETECTED NONE DETECTED   Cocaine Metabolite,Ur [place] NONE DETECTED NONE DETECTED   Opiate, Ur Screen NONE DETECTED NONE DETECTED   Phencyclidine (PCP) Ur S NONE DETECTED NONE DETECTED   Cannabinoid 50 Ng, Ur [place] NONE DETECTED NONE DETECTED   Barbiturates, Ur Screen NONE DETECTED NONE DETECTED  Benzodiazepine, Ur Scrn POSITIVE (A) NONE  DETECTED   Methadone Scn, Ur NONE DETECTED NONE DETECTED    Comment: (NOTE) Tricyclics + metabolites, urine    Cutoff 1000 ng/mL Amphetamines + metabolites, urine  Cutoff 1000 ng/mL MDMA (Ecstasy), urine              Cutoff 500 ng/mL Cocaine Metabolite, urine          Cutoff 300 ng/mL Opiate + metabolites, urine        Cutoff 300 ng/mL Phencyclidine (PCP), urine         Cutoff 25 ng/mL Cannabinoid, urine                 Cutoff 50 ng/mL Barbiturates + metabolites, urine  Cutoff 200 ng/mL Benzodiazepine, urine              Cutoff 200 ng/mL Methadone, urine                   Cutoff 300 ng/mL The urine drug screen provides only a preliminary, unconfirmed analytical test result and should not be used for non-medical purposes. Clinical consideration and professional judgment should be applied to any positive drug screen result due to possible interfering substances. A more specific alternate chemical method must be used in order to obtain a confirmed analytical result. Gas chromatography / mass spectrometry (GC/MS) is the preferred confirmat ory method. Performed at [facility], [address] [place], [place] [address]   SARS Coronavirus 2 (CEPHEID - Performed in [facility] hospital lab), Hosp Order     Status: None   Collection Time: 11/04/18  2:34 PM  Result Value Ref Range   SARS Coronavirus 2 NEGATIVE NEGATIVE    Comment: (NOTE) If result is NEGATIVE SARS-CoV-2 target nucleic acids are NOT DETECTED. The SARS-CoV-2 RNA is generally detectable in upper and lower  respiratory specimens during the acute phase of infection. The lowest  concentration of SARS-CoV-2 viral copies this assay can detect is 250  copies / mL. A negative result does not preclude SARS-CoV-2 infection  and should not be used as the sole basis for treatment or other  patient management decisions.  A negative result may occur with  improper specimen collection / handling, submission of specimen other   than nasopharyngeal swab, presence of viral mutation(s) within the  areas targeted by this assay, and inadequate number of viral copies  (<250 copies / mL). A negative result must be combined with clinical  observations, patient history, and epidemiological information. If result is POSITIVE SARS-CoV-2 target nucleic acids are DETECTED. The SARS-CoV-2 RNA is generally detectable in upper and lower  respiratory specimens dur ing the acute phase of infection.  Positive  results are indicative of active infection with SARS-CoV-2.  Clinical  correlation with patient history and other diagnostic information is  necessary to determine patient infection status.  Positive results do  not rule out bacterial infection or co-infection with other viruses. If result is PRESUMPTIVE POSTIVE SARS-CoV-2 nucleic acids MAY BE PRESENT.   A presumptive positive result was obtained on the submitted specimen  and confirmed on repeat testing.  While 2019 novel coronavirus  (SARS-CoV-2) nucleic acids may be present in the submitted sample  additional confirmatory testing may be necessary for epidemiological  and / or clinical management purposes  to differentiate between  SARS-CoV-2 and other Sarbecovirus currently known to infect humans.  If clinically indicated additional testing with an alternate test  methodology [phone]) is advised. The SARS-CoV-2 RNA  is generally  detectable in upper and lower respiratory sp ecimens during the acute  phase of infection. The expected result is Negative. Fact Sheet for Patients:  BoilerBrush.com.cy Fact Sheet for Healthcare Providers: https://pope.com/ This test is not yet approved or cleared by the [place] FDA and has been authorized for detection and/or diagnosis of SARS-CoV-2 by FDA under an Emergency Use Authorization (EUA).  This EUA will remain in effect (meaning this test can be used) for the duration of  the COVID-19 declaration under Section 564(b)(1) of the Act, 21 U.S.C. section 360bbb-3(b)(1), unless the authorization is terminated or revoked sooner. Performed at [facility], [address] [place], [place] [address]     Blood Alcohol level:  Lab Results  Component Value Date   [facility] <10 11/04/2018    Metabolic Disorder Labs:  Lab Results  Component Value Date   HGBA1C 5.3 07/02/2016   MPG 105 07/02/2016   No results found for: PROLACTIN No results found for: CHOL, TRIG, HDL, CHOLHDL, VLDL, LDLCALC  Current Medications: Current Facility-Administered Medications  Medication Dose Route Frequency Provider Last Rate Last Dose  . acetaminophen (TYLENOL) tablet 650 mg  650 mg Oral Q4H PRN Mariel Craft, MD   650 mg at 11/05/18 [phone]  . ALPRAZolam (XANAX) tablet 0.5 mg  0.5 mg Oral QHS Anneliese Leblond T, MD      . alum & mag hydroxide-simeth (MAALOX/MYLANTA) 200-200-20 MG/5ML suspension 30 mL  30 mL Oral Q4H PRN Mariel Craft, MD      . Melene Muller ON 11/06/2018] buPROPion (WELLBUTRIN XL) 24 hr tablet 300 mg  300 mg Oral Daily Letty Salvi T, MD      . ibuprofen (ADVIL) tablet 600 mg  600 mg Oral Q6H PRN Xyler Terpening T, MD      . magnesium hydroxide (MILK OF MAGNESIA) suspension 30 mL  30 mL Oral Daily PRN Mariel Craft, MD      . metoprolol succinate (TOPROL-XL) 24 hr tablet 12.5 mg  12.5 mg Oral Daily Lenise Jr T, MD   12.5 mg at 11/05/18 1304  . NON FORMULARY 30 mg  30 mg Oral TID Treyven Lafauci T, MD      . ondansetron [facility]) tablet 4 mg  4 mg Oral Q8H PRN Mariel Craft, MD      . polyvinyl alcohol (LIQUIFILM TEARS) 1.4 % ophthalmic solution 1 drop  1 drop Both Eyes PRN Leonda Cristo, Jackquline Denmark, MD   1 drop at 11/05/18 1411  . progesterone (PROMETRIUM) capsule 100 mg  100 mg Oral Daily Mariel Craft, MD      . raloxifene (EVISTA) tablet 60 mg  60 mg Oral Daily Mariel Craft, MD   60 mg at 11/05/18 1101  . zolpidem (AMBIEN) tablet 10 mg  10 mg Oral QHS PRN  Nahsir Venezia, Jackquline Denmark, MD       PTA Medications: Medications Prior to Admission  Medication Sig Dispense Refill Last Dose  . ALPRAZolam (XANAX) 0.5 MG tablet Take 0.5 mg by mouth at bedtime as needed for anxiety.   11/03/2018 at Unknown time  . amitriptyline (ELAVIL) 25 MG tablet Take 25 mg by mouth at bedtime.   11/03/2018 at Unknown time  . buPROPion (WELLBUTRIN XL) 150 MG 24 hr tablet Take 150 mg by mouth daily.   11/03/2018 at Unknown time  . Calcium-Vitamin D-Vitamin K (VIACTIV PO) Take by mouth.   Taking  . conjugated estrogens (PREMARIN) vaginal cream Place 0.5 mg vaginally daily.  Taking  . esomeprazole (NEXIUM) 40 MG capsule Take by mouth.   Taking  . ibuprofen (ADVIL,MOTRIN) 200 MG tablet Take 200 mg by mouth every 6 (six) hours as needed for fever, headache or moderate pain.   Past Month at Unknown time  . metoprolol succinate (TOPROL-XL) 100 MG 24 hr tablet Take 25 mg by mouth daily. Take with or immediately following a meal.   11/03/2018 at Unknown time  . Multiple Vitamin (MULTIVITAMIN WITH MINERALS) TABS tablet Take 1 tablet by mouth daily.   11/03/2018 at Unknown time  . progesterone (PROMETRIUM) 100 MG capsule Take 100 mg by mouth daily.   11/03/2018 at Unknown time  . raloxifene (EVISTA) 60 MG tablet Take 60 mg by mouth daily.   11/03/2018 at Unknown time  . zolpidem (AMBIEN) 10 MG tablet Take 10 mg by mouth at bedtime as needed for sleep.   11/03/2018 at Unknown time    Musculoskeletal: Strength & Muscle Tone: within normal limits Gait & Station: normal Patient leans: N/A  Psychiatric Specialty Exam: Physical Exam  Nursing note and vitals reviewed. Constitutional: She appears well-developed and well-nourished.  HENT:  Head: Normocephalic and atraumatic.  Eyes: Pupils are equal, round, and reactive to light. Conjunctivae are normal.  Neck: Normal range of motion.  Cardiovascular: Regular rhythm and normal heart sounds.  Respiratory: Effort normal.  GI: Soft.  Musculoskeletal:  Normal range of motion.  Neurological: She is alert.  Skin: Skin is warm and dry.  Psychiatric: Her speech is delayed. She is slowed. Cognition and memory are normal. She expresses impulsivity. She exhibits a depressed mood. She expresses no suicidal ideation.    Review of Systems  Constitutional: Negative.   HENT: Negative.   Eyes: Negative.   Respiratory: Negative.   Cardiovascular: Negative.   Gastrointestinal: Negative.   Musculoskeletal: Positive for myalgias.  Skin: Negative.   Neurological: Negative.   Psychiatric/Behavioral: Positive for depression. Negative for hallucinations, memory loss, substance abuse and suicidal ideas. The patient is nervous/anxious and has insomnia.     Blood pressure 115/77, pulse 95, temperature 98.6 F (37 C), temperature source Oral, resp. rate 16, height  (1.727 m), weight 53.1 kg, SpO2 100 %.Body mass index is 17.79 kg/m.  General Appearance: Casual  Eye Contact:  Good  Speech:  Clear and Coherent  Volume:  Normal  Mood:  Anxious and Dysphoric  Affect:  Congruent  Thought Process:  Coherent  Orientation:  Full (Time, Place, and Person)  Thought Content:  Logical  Suicidal Thoughts:  No  Homicidal Thoughts:  No  Memory:  Immediate;   Fair Recent;   Fair Remote;   Fair  Judgement:  Fair  Insight:  Fair  Psychomotor Activity:  Normal  Concentration:  Concentration: Fair  Recall:  [employer] of Knowledge:  Fair  Language:  Fair  Akathisia:  No  Handed:  Right  AIMS (if indicated):     Assets:  Desire for Improvement Housing Social Support  ADL's:  Intact  Cognition:  WNL  Sleep:  Number of Hours: 4.25    Treatment Plan Summary: Daily contact with patient to assess and evaluate symptoms and progress in treatment, Medication management and Plan Discussed possible medicine changes with the patient.  Pointed out that 150 mg is a low dose of Wellbutrin and propose that we increase the dose to 300 mg.  No other change to current  medicine.  Particularly since she has had poor responses to serotonergic medicines in the past.  Engage  patient in appropriate activities on the unit.  Does not appear to be acutely dangerous.  Likely to be discharged tomorrow  Observation Level/Precautions:  15 minute checks  Laboratory:  UA  Psychotherapy:    Medications:    Consultations:    Discharge Concerns:    Estimated LOS:  Other:     Physician Treatment Plan for Primary Diagnosis: MDD (major depressive disorder), recurrent severe, without psychosis (HCC) Long Term Goal(s): Improvement in symptoms so as ready for discharge  Short Term Goals: Ability to verbalize feelings will improve, Ability to disclose and discuss suicidal ideas and Ability to demonstrate self-control will improve  Physician Treatment Plan for Secondary Diagnosis: Principal Problem:   MDD (major depressive disorder), recurrent severe, without psychosis (HCC) Active Problems:   Fibromyalgia   GERD (gastroesophageal reflux disease)   IBS (irritable bowel syndrome)   Primary Sjogren's syndrome (HCC)  Long Term Goal(s): Improvement in symptoms so as ready for discharge  Short Term Goals: Compliance with prescribed medications will improve  I certify that inpatient services furnished can reasonably be expected to improve the patient's condition.    Mordecai RasmussenJohn Ausar Georgiou, MD 6/2/20205:25 PM

## 2018-11-06 MED ORDER — POLYVINYL ALCOHOL 1.4 % OP SOLN: 1.0000 [drp] | OPHTHALMIC | 1 refills | Status: AC | PRN

## 2018-11-06 MED ORDER — BUPROPION HCL ER (XL) 300 MG PO TB24: 300.0000 mg | ORAL_TABLET | Freq: Every day | ORAL | 1 refills | Status: AC

## 2018-11-06 NOTE — Progress Notes (Signed)
D: Patient is aware of  Discharge this shift .Patient denies suicidal /homicidal ideations. Patient received all belongings brought in  A: No Storage medications. Writer reviewed Discharge Summary, Suicide Risk Assessment, and Transitional Record. Patient also received Prescriptions   from  MD.Aware  Of follow up appointment . R: Patient left unit with no questions  Or concerns  With husband 

## 2018-11-06 NOTE — Progress Notes (Signed)
  [facility] Adult Case Management Discharge Plan :  Will you be returning to the same living situation after discharge:  Yes,  pt is returning to the home with her family. At discharge, do you have transportation home?: Yes,  husband will provide transportation. Do you have the ability to pay for your medications: Yes,  [employer]   Release of information consent forms completed and in the chart;  Patient's signature needed at discharge.  Patient to Follow up at: Follow-up Information    [facility] up on 11/13/2018.   Why:  Please follow up with [facility] on Wednesday, June 10th at 3:00pm. Please take your hospital discharge paperwork with you to your appointment.   Thank you. Contact information: [address] [place] [address] [phone]        [facility] Follow up on 11/07/2018.   Why:  Please follow up with Robby Sermon, [facility] on Thursday, June 4th at 11:00am.  Please take your hospital discharge paperwork with you to your appointment.  Thank you.  Contact information: [address]  [place], [place] [address] [phone]          Next level of care provider has access to [facility] Link:no  Safety Planning and Suicide Prevention discussed: Yes,  SPE completed with the patient.   Have you used any form of tobacco in the last 30 days? (Cigarettes, Smokeless Tobacco, Cigars, and/or Pipes): No  Has patient been referred to the Quitline?: N/A patient is not a smoker  Patient has been referred for addiction treatment: N/A  Harden Mo, LCSW 11/06/2018, 10:49 AM

## 2018-11-06 NOTE — B[facility] Counselor (Signed)
CSW spoke with the patients husband who expressed concerns at the patient being released "so soon".  CSW informed that she had staffed the husbands previous concerns from the day before with Dr. Toni Amend.  CSW informed that she will message Dr. Toni Amend and ask him to give the patient further clarification, however, patient does not appear to be a danger to self or others at this time, per progression meeting this morning.   Penni Homans, MSW, LCSW 11/06/2018 10:48 AM

## 2018-11-06 NOTE — Progress Notes (Signed)
Patient alert and oriented x 4, affect is flat but she brightens upon approach, patient denies SI/HI/AVH noted interacting appropriately with and staff, visible in the milieu, no distress noted, she was complaint with prescribed medication regimen. Patient was offered support and encouragement. 15 minutes safety checks maintained will continiue to monitor.

## 2018-11-06 NOTE — Plan of Care (Signed)
  Problem: Medication: Goal: Compliance with prescribed medication regimen will improve Outcome: Progressing  Patient compliant with prescribed medication regimen and she has insight about medication use.

## 2018-11-06 NOTE — Discharge Summary (Signed)
Physician Discharge Summary Note  Patient:  Traci Lester is an 58 y.o., female MRN:  [id number] DOB:  03-Apr-1961 Patient phone:  [phone] (home)  Patient address:   [address] [place] [address],  Total Time spent with patient: 45 minutes  Date of Admission:  11/04/2018 Date of Discharge: November 06, 2018  Reason for Admission: Admitted through the emergency room where she presented with acute on chronic depression and anxiety and having voiced some suicidal ideation  Principal Problem: MDD (major depressive disorder), recurrent severe, without psychosis (HCC) Discharge Diagnoses: Principal Problem:   MDD (major depressive disorder), recurrent severe, without psychosis (HCC) Active Problems:   Fibromyalgia   GERD (gastroesophageal reflux disease)   IBS (irritable bowel syndrome)   Primary Sjogren's syndrome (HCC)   Past Psychiatric History: Patient has a history of depression for which she is being treated as an outpatient.  No previous hospitalizations or suicide attempts  Past Medical History:  Past Medical History:  Diagnosis Date  . Allergy   . Fibromyalgia   . IBS (irritable bowel syndrome)   . PVC (premature ventricular contraction)   . Sjogren's syndrome [facility](HCC)     Past Surgical History:  Procedure Laterality Date  . ABDOMINAL HYSTERECTOMY    . CESAREAN SECTION     times 3  . COLONOSCOPY WITH PROPOFOL N/A 01/15/2017   Procedure: COLONOSCOPY WITH PROPOFOL;  Surgeon: Christena DeemSkulskie, Martin U, MD;  [facility];  Service: Endoscopy;  Laterality: N/A;  . KNEE SURGERY     Family History:  Family History  Problem Relation Age of Onset  . Breast cancer Mother 6440  . Breast cancer Maternal Aunt 5840   Family Psychiatric  History: See previous. Social History:  Social History   Substance and Sexual Activity  Alcohol Use Yes  . Alcohol/week: 3.0 standard drinks  . Types: 3 Standard drinks or equivalent per week     Social History    Substance and Sexual Activity  Drug Use No    Social History   Socioeconomic History  . Marital status: Married    Spouse name: Not on file  . Number of children: Not on file  . Years of education: Not on file  . Highest education level: Not on file  Occupational History  . Not on file  Social Needs  . Financial resource strain: Not on file  . Food insecurity:    Worry: Not on file    Inability: Not on file  . Transportation needs:    Medical: Not on file    Non-medical: Not on file  Tobacco Use  . Smoking status: Never Smoker  . Smokeless tobacco: Never Used  Substance and Sexual Activity  . Alcohol use: Yes    Alcohol/week: 3.0 standard drinks    Types: 3 Standard drinks or equivalent per week  . Drug use: No  . Sexual activity: Not on file  Lifestyle  . Physical activity:    Days per week: Not on file    Minutes per session: Not on file  . Stress: Not on file  Relationships  . Social connections:    Talks on phone: Not on file    Gets together: Not on file    Attends religious service: Not on file    Active member of club or organization: Not on file    Attends meetings of clubs or organizations: Not on file    Relationship status: Not on file  Other Topics Concern  . Not  on file  Social History Narrative  . Not on file    Hospital Course: Patient was admitted to the psychiatric ward.  15-minute checks were employed.  Patient did not display any dangerous or suicidal behavior during her time in the hospital.  She was cooperative with treatment.  After I spoke with her I suggested one change we could make to her medicine was to increase her Wellbutrin which was at a relatively low dose.  Patient was insistent throughout her time in the hospital that she was not suicidal.  She was able to articulate multiple positive things in her life and positive plans for the future.  She showed no sign of psychosis.  Patient did not appear to meet commitment criteria.  She  had appropriate outpatient treatment.  Additionally we were unable to provide some of her prescribed medication because they were nonformulary which made staying in the hospital even more difficult.  Patient was discharged today with plans that she follow-up in the outpatient community.  Prescriptions given for new medications.  Patient's husband had called and requested I call to speak with him about the rationale for discharge.  I asked the patient for specific consent to do that and she declined.  Physical Findings: AIMS: Facial and Oral Movements Muscles of Facial Expression: None, normal Lips and Perioral Area: None, normal Jaw: None, normal Tongue: None, normal,Extremity Movements Upper (arms, wrists, hands, fingers): None, normal Lower (legs, knees, ankles, toes): None, normal, Trunk Movements Neck, shoulders, hips: None, normal, Overall Severity Severity of abnormal movements (highest score from questions above): None, normal Incapacitation due to abnormal movements: None, normal Patient's awareness of abnormal movements (rate only patient's report): No Awareness, Dental Status Current problems with teeth and/or dentures?: No Does patient usually wear dentures?: No  CIWA:  CIWA-Ar Total: 0 COWS:  COWS Total Score: 2  Musculoskeletal: Strength & Muscle Tone: within normal limits Gait & Station: normal Patient leans: N/A  Psychiatric Specialty Exam: Physical Exam  Nursing note and vitals reviewed. Constitutional: She appears well-developed and well-nourished.  HENT:  Head: Normocephalic and atraumatic.  Eyes: Pupils are equal, round, and reactive to light. Conjunctivae are normal.  Neck: Normal range of motion.  Cardiovascular: Regular rhythm and normal heart sounds.  Respiratory: Effort normal.  GI: Soft.  Musculoskeletal: Normal range of motion.  Neurological: She is alert.  Skin: Skin is warm and dry.  Psychiatric: She has a normal mood and affect. Her behavior is  normal. Judgment and thought content normal.    Review of Systems  Constitutional: Negative.   HENT: Negative.   Eyes: Negative.   Respiratory: Negative.   Cardiovascular: Negative.   Gastrointestinal: Negative.   Musculoskeletal: Negative.   Skin: Negative.   Neurological: Negative.   Psychiatric/Behavioral: Negative.     Blood pressure 101/75, pulse 82, temperature 97.8 F (36.6 C), temperature source Oral, resp. rate 18, height 5\' 8"  (1.727 m), weight 53.1 kg, SpO2 99 %.Body mass index is 17.79 kg/m.  General Appearance: Casual  Eye Contact:  Good  Speech:  Clear and Coherent  Volume:  Normal  Mood:  Euthymic  Affect:  Congruent  Thought Process:  Goal Directed  Orientation:  Full (Time, Place, and Person)  Thought Content:  Logical  Suicidal Thoughts:  No  Homicidal Thoughts:  No  Memory:  Immediate;   Fair Recent;   Fair Remote;   Fair  Judgement:  Fair  Insight:  Fair  Psychomotor Activity:  Normal  Concentration:  Concentration: Fair  Recall:  Jennelle Human of Knowledge:  Fair  Language:  Fair  Akathisia:  No  Handed:  Right  AIMS (if indicated):     Assets:  Desire for Improvement Financial Resources/Insurance Housing Resilience Social Support  ADL's:  Intact  Cognition:  WNL  Sleep:  Number of Hours: 8     Have you used any form of tobacco in the last 30 days? (Cigarettes, Smokeless Tobacco, Cigars, and/or Pipes): No  Has this patient used any form of tobacco in the last 30 days? (Cigarettes, Smokeless Tobacco, Cigars, and/or Pipes) Yes, No  Blood Alcohol level:  Lab Results  Component Value Date   ETH <10 11/04/2018    Metabolic Disorder Labs:  Lab Results  Component Value Date   HGBA1C 5.3 07/02/2016   MPG 105 07/02/2016   No results found for: PROLACTIN No results found for: CHOL, TRIG, HDL, CHOLHDL, VLDL, LDLCALC  See Psychiatric Specialty Exam and Suicide Risk Assessment completed by Attending Physician prior to discharge.  Discharge  destination:  Home  Is patient on multiple antipsychotic therapies at discharge:  No   Has Patient had three or more failed trials of antipsychotic monotherapy by history:  No  Recommended Plan for Multiple Antipsychotic Therapies: NA  Discharge Instructions    Diet - low sodium heart healthy   Complete by:  As directed    Increase activity slowly   Complete by:  As directed      Allergies as of 11/06/2018      Reactions   Codeine Other (See Comments)   Other Reaction: stomach pains Abdominal pain   Duloxetine Other (See Comments)   Other Diarrhea   Dairy containing products Wheat containing products   Tramadol Hives   Wheat Bran Diarrhea   Wheat and wheat containing products   Sulfa Antibiotics       Medication List    TAKE these medications     Indication  ALPRAZolam 0.5 MG tablet Commonly known as:  XANAX Take 0.5 mg by mouth at bedtime as needed for anxiety.  Indication:  Feeling Anxious   amitriptyline 25 MG tablet Commonly known as:  ELAVIL Take 25 mg by mouth at bedtime.  Indication:  Fibromyalgia Syndrome   buPROPion 300 MG 24 hr tablet Commonly known as:  WELLBUTRIN XL Take 1 tablet (300 mg total) by mouth daily. Start taking on:  November 07, 2018 What changed:    medication strength  how much to take  Indication:  Major Depressive Disorder   conjugated estrogens vaginal cream Commonly known as:  PREMARIN Place 0.5 mg vaginally daily.  Indication:  Atrophic Vaginitis   esomeprazole 40 MG capsule Commonly known as:  NEXIUM Take by mouth.  Indication:  Gastroesophageal Reflux Disease   ibuprofen 200 MG tablet Commonly known as:  ADVIL Take 200 mg by mouth every 6 (six) hours as needed for fever, headache or moderate pain.  Indication:  Arthritis   metoprolol succinate 100 MG 24 hr tablet Commonly known as:  TOPROL-XL Take 25 mg by mouth daily. Take with or immediately following a meal.  Indication:  Anxiety   multivitamin with minerals Tabs  tablet Take 1 tablet by mouth daily.  Indication:  Vitamin deficiency   polyvinyl alcohol 1.4 % ophthalmic solution Commonly known as:  LIQUIFILM TEARS Place 1 drop into both eyes as needed for dry eyes.  Indication:  Xerosis   progesterone 100 MG capsule Commonly known as:  PROMETRIUM Take 100 mg by mouth daily.  Indication:  Overgrowth of the Uterine Lining   raloxifene 60 MG tablet Commonly known as:  EVISTA Take 60 mg by mouth daily.  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  Indication:  Vitamin D deficiency   zolpidem 10 MG tablet Commonly known as:  AMBIEN Take 10 mg by mouth at bedtime as needed for sleep.  Indication:  Trouble Sleeping      Follow-up Information    [facility] up on 11/13/2018.   Why:  Please follow up with [facility] on Wednesday, June 10th at 3:00pm. Please take your hospital discharge paperwork with you to your appointment.   Thank you. Contact information: [address] [place] [address] [phone]        [facility] Follow up on 11/07/2018.   Why:  Please follow up with Robby Sermon, [facility] on Thursday, June 4th at 11:00am.  Please take your hospital discharge paperwork with you to your appointment.  Thank you.  Contact information: [address]  [place], [place] [address] [phone]          Follow-up recommendations:  Activity:  Activity as tolerated Diet:  Regular diet Other:  Follow-up outpatient with current providers of psychiatric and psychological services.  Comments: At the time of discharge patient appeared to be much improved in her affect and mood.  Completely denied suicidal ideation.  Felt hopeful and optimistic about the future.  Able to articulate an appropriate outpatient treatment plan.  Signed: Mordecai Rasmussen, MD 11/06/2018, 5:33 PM

## 2018-11-06 NOTE — Progress Notes (Signed)
Recreation Therapy Notes  Date: 11/06/2018  Time: 9:30 am   Location: Craft room   Behavioral response: N/A   Intervention Topic: Wellness  Discussion/Intervention: Patient did not attend group.   Clinical Observations/Feedback:  Patient did not attend group.   Daylani Deblois LRT/CTRS        Sorina Derrig 11/06/2018 10:59 AM

## 2018-11-06 NOTE — [facility] Suicide Risk Assessment (Signed)
[facility]   Principal Problem: MDD (major depressive disorder), recurrent severe, without psychosis (HCC) Discharge Diagnoses: Principal Problem:   MDD (major depressive disorder), recurrent severe, without psychosis (HCC) Active Problems:   Fibromyalgia   GERD (gastroesophageal reflux disease)   IBS (irritable bowel syndrome)   Primary Sjogren's syndrome (HCC)   Total Time spent with patient: 45 minutes  Musculoskeletal: Strength & Muscle Tone: within normal limits Gait & Station: normal Patient leans: N/A  Psychiatric Specialty Exam: Review of Systems  Constitutional: Negative.   HENT: Negative.   Eyes: Negative.   Respiratory: Negative.   Cardiovascular: Negative.   Gastrointestinal: Negative.   Musculoskeletal: Negative.   Skin: Negative.   Neurological: Negative.   Psychiatric/Behavioral: Positive for depression. Negative for hallucinations, substance abuse and suicidal ideas. The patient is nervous/anxious.     Blood pressure 101/75, pulse 82, temperature 97.8 F (36.6 C), temperature source Oral, resp. rate 18, height 5\' 8"  (1.727 m), weight 53.1 kg, SpO2 99 %.Body mass index is 17.79 kg/m.  General Appearance: Casual  Eye Contact::  Good  Speech:  Clear and Coherent409  Volume:  Normal  Mood:  Euthymic  Affect:  Congruent  Thought Process:  Coherent  Orientation:  Full (Time, Place, and Person)  Thought Content:  Logical  Suicidal Thoughts:  No  Homicidal Thoughts:  No  Memory:  Immediate;   Fair Recent;   Fair Remote;   Fair  Judgement:  Fair  Insight:  Fair  Psychomotor Activity:  Normal  Concentration:  Fair  Recall:  FiservFair  Fund of Knowledge:Fair  Language: Fair  Akathisia:  No  Handed:  Right  AIMS (if indicated):     Assets:  Desire for Improvement Housing Resilience  Sleep:  Number of Hours: 8  Cognition: WNL  ADL's:  Intact   Mental Status Per Nursing Assessment::   On Admission:  NA  Demographic Factors:   Caucasian  Loss Factors: Decline in physical health  Historical Factors: Impulsivity  Risk Reduction Factors:   Sense of responsibility to family, Living with another person, especially a relative, Positive social support and Positive therapeutic relationship  Continued Clinical Symptoms:  Depression:   Impulsivity  Cognitive Features That Contribute To Risk:  Thought constriction (tunnel vision)    Suicide Risk:  Minimal: No identifiable suicidal ideation.  Patients presenting with no risk factors but with morbid ruminations; may be classified as minimal risk based on the severity of the depressive symptoms  Follow-up Information    [facility] on 11/13/2018.   Why:  [facility] on Wednesday, June 10th at 3:00pm. Please take your hospital discharge paperwork with you to your appointment.   Thank you. Contact information: [address] [place] [address] [phone]        [facility] Follow up on 11/07/2018.   Why:  Please follow up with Robby SermonPatty Sprouse, [facility] on Thursday, June 4th at 11:00am.  Please take your hospital discharge paperwork with you to your appointment.  Thank you.  Contact information: [address],  [place], [place] [address] [phone]          Plan Of Care/Follow-up recommendations:  Activity:  Activity as tolerated Diet:  Regular diet Other:  Patient is to follow-up with her current mental health therapist and medical provider continue current medicine work on anxiety control.  Patient is completely denying suicidal ideation.  Did not do anything intentionally to harm her self.  Shows good insight.  Mordecai Rasmussen, MD 11/06/2018, 9:19 AM

## 2018-12-04 ENCOUNTER — Other Ambulatory Visit: Payer: Self-pay

## 2018-12-04 ENCOUNTER — Ambulatory Visit: Payer: Medicare Other

## 2018-12-04 ENCOUNTER — Ambulatory Visit
Admission: RE | Admit: 2018-12-04 | Discharge: 2018-12-04 | Disposition: A | Discharge status: Home / Self Care | Payer: 59 | Source: Ambulatory Visit | Attending: Internal Medicine | Admitting: Internal Medicine

## 2018-12-04 DIAGNOSIS — Z1231 Encounter for screening mammogram for malignant neoplasm of breast: Secondary | ICD-10-CM | POA: Insufficient documentation

## 2018-12-04 DIAGNOSIS — R599 Enlarged lymph nodes, unspecified: Secondary | ICD-10-CM

## 2019-06-09 ENCOUNTER — Other Ambulatory Visit: Payer: Self-pay | Admitting: Internal Medicine

## 2019-06-09 DIAGNOSIS — R270 Ataxia, unspecified: Secondary | ICD-10-CM

## 2019-06-19 ENCOUNTER — Other Ambulatory Visit: Payer: Self-pay

## 2019-06-19 ENCOUNTER — Ambulatory Visit
Admission: RE | Admit: 2019-06-19 | Discharge: 2019-06-19 | Disposition: A | Discharge status: Home / Self Care | Payer: 59 | Source: Ambulatory Visit | Attending: Internal Medicine | Admitting: Internal Medicine

## 2019-06-19 ENCOUNTER — Encounter (INDEPENDENT_AMBULATORY_CARE_PROVIDER_SITE_OTHER): Payer: Self-pay

## 2019-06-19 DIAGNOSIS — R270 Ataxia, unspecified: Secondary | ICD-10-CM | POA: Diagnosis not present

## 2020-03-23 ENCOUNTER — Other Ambulatory Visit: Payer: Self-pay | Admitting: Internal Medicine

## 2020-03-23 DIAGNOSIS — Z1231 Encounter for screening mammogram for malignant neoplasm of breast: Secondary | ICD-10-CM

## 2020-12-21 ENCOUNTER — Ambulatory Visit
Admission: RE | Admit: 2020-12-21 | Discharge: 2020-12-21 | Disposition: A | Discharge status: Home / Self Care | Payer: 59 | Source: Ambulatory Visit | Attending: Internal Medicine | Admitting: Internal Medicine

## 2020-12-21 ENCOUNTER — Other Ambulatory Visit: Payer: Self-pay

## 2020-12-21 DIAGNOSIS — Z1231 Encounter for screening mammogram for malignant neoplasm of breast: Secondary | ICD-10-CM | POA: Insufficient documentation

## 2021-11-06 ENCOUNTER — Other Ambulatory Visit: Payer: Self-pay

## 2021-11-06 ENCOUNTER — Emergency Department: Payer: 59

## 2021-11-06 ENCOUNTER — Emergency Department
Admission: EM | Admit: 2021-11-06 | Discharge: 2021-11-06 | Disposition: A | Discharge status: Home / Self Care | Payer: 59 | Attending: Emergency Medicine | Admitting: Emergency Medicine

## 2021-11-06 DIAGNOSIS — Z79899 Other long term (current) drug therapy: Secondary | ICD-10-CM | POA: Insufficient documentation

## 2021-11-06 DIAGNOSIS — E860 Dehydration: Secondary | ICD-10-CM | POA: Insufficient documentation

## 2021-11-06 DIAGNOSIS — R112 Nausea with vomiting, unspecified: Secondary | ICD-10-CM | POA: Diagnosis present

## 2021-11-06 DIAGNOSIS — R202 Paresthesia of skin: Secondary | ICD-10-CM | POA: Insufficient documentation

## 2021-11-06 DIAGNOSIS — R531 Weakness: Secondary | ICD-10-CM | POA: Diagnosis not present

## 2021-11-06 DIAGNOSIS — F12920 Cannabis use, unspecified with intoxication, uncomplicated: Secondary | ICD-10-CM

## 2021-11-06 DIAGNOSIS — F12129 Cannabis abuse with intoxication, unspecified: Secondary | ICD-10-CM | POA: Diagnosis not present

## 2021-11-06 LAB — CBC
HCT: 41.7 % (ref 36.0–46.0)
Hemoglobin: 13.8 g/dL (ref 12.0–15.0)
MCH: 31.3 pg (ref 26.0–34.0)
MCHC: 33.1 g/dL (ref 30.0–36.0)
MCV: 94.6 fL (ref 80.0–100.0)
Platelets: 200 10*3/uL (ref 150–400)
RBC: 4.41 MIL/uL (ref 3.87–5.11)
RDW: 13.6 % (ref 11.5–15.5)
WBC: 4.4 10*3/uL (ref 4.0–10.5)
nRBC: 0 % (ref 0.0–0.2)

## 2021-11-06 LAB — COMPREHENSIVE METABOLIC PANEL
ALT: 34 U/L (ref 0–44)
AST: 31 U/L (ref 15–41)
Albumin: 4.2 g/dL (ref 3.5–5.0)
Alkaline Phosphatase: 51 U/L (ref 38–126)
Anion gap: 9 (ref 5–15)
BUN: 15 mg/dL (ref 6–20)
CO2: 19 mmol/L — ABNORMAL LOW (ref 22–32)
Calcium: 9 mg/dL (ref 8.9–10.3)
Chloride: 110 mmol/L (ref 98–111)
Creatinine, Ser: 0.51 mg/dL (ref 0.44–1.00)
GFR, Estimated: >60 mL/min (ref >60)
Glucose, Bld: 177 mg/dL — ABNORMAL HIGH (ref 70–99)
Potassium: 3.5 mmol/L (ref 3.5–5.1)
Sodium: 138 mmol/L (ref 135–145)
Total Bilirubin: 0.7 mg/dL (ref 0.3–1.2)
Total Protein: 7.7 g/dL (ref 6.5–8.1)

## 2021-11-06 LAB — URINALYSIS, ROUTINE W REFLEX MICROSCOPIC
Bilirubin Urine: NEGATIVE
Glucose, UA: NEGATIVE mg/dL
Hgb urine dipstick: NEGATIVE
Ketones, ur: 5 mg/dL — AB
Leukocytes,Ua: NEGATIVE
Nitrite: NEGATIVE
Protein, ur: NEGATIVE mg/dL
Specific Gravity, Urine: 1.01 (ref 1.005–1.030)
pH: 9 — ABNORMAL HIGH (ref 5.0–8.0)

## 2021-11-06 LAB — URINE DRUG SCREEN, QUALITATIVE (ARMC ONLY)
Amphetamines, Ur Screen: NOT DETECTED
Barbiturates, Ur Screen: NOT DETECTED
Benzodiazepine, Ur Scrn: NOT DETECTED
Cannabinoid 50 Ng, Ur ~~LOC~~: POSITIVE — AB
Cocaine Metabolite,Ur ~~LOC~~: NOT DETECTED
MDMA (Ecstasy)Ur Screen: NOT DETECTED
Methadone Scn, Ur: NOT DETECTED
Opiate, Ur Screen: NOT DETECTED
Phencyclidine (PCP) Ur S: NOT DETECTED
Tricyclic, Ur Screen: NOT DETECTED

## 2021-11-06 LAB — LIPASE, BLOOD: Lipase: 26 U/L (ref 11–51)

## 2021-11-06 MED ORDER — DIPHENHYDRAMINE HCL 50 MG/ML IJ SOLN
25.0000 mg | Freq: Once | INTRAMUSCULAR | Status: AC
  Administered 2021-11-06: 25 mg via INTRAVENOUS
  Filled 2021-11-06: qty 1

## 2021-11-06 MED ORDER — LORAZEPAM 2 MG/ML IJ SOLN: 0.5000 mg | Freq: Once | INTRAMUSCULAR | Status: DC

## 2021-11-06 MED ORDER — SODIUM CHLORIDE 0.9 % IV BOLUS
1000.0000 mL | Freq: Once | INTRAVENOUS | Status: AC
  Administered 2021-11-06: 1000 mL via INTRAVENOUS

## 2021-11-06 MED ORDER — IOHEXOL 300 MG/ML  SOLN
100.0000 mL | Freq: Once | INTRAMUSCULAR | Status: AC | PRN
Start: 2021-11-06 — End: 2021-11-06
  Administered 2021-11-06: 100 mL via INTRAVENOUS

## 2021-11-06 MED ORDER — HALOPERIDOL LACTATE 5 MG/ML IJ SOLN
2.0000 mg | Freq: Once | INTRAMUSCULAR | Status: DC
  Filled 2021-11-06: qty 1

## 2021-11-06 MED ORDER — ONDANSETRON HCL 4 MG/2ML IJ SOLN
4.0000 mg | Freq: Once | INTRAMUSCULAR | Status: AC
Start: 2021-11-06 — End: 2021-11-06
  Administered 2021-11-06: 4 mg via INTRAVENOUS
  Filled 2021-11-06: qty 2

## 2021-11-06 MED ORDER — ONDANSETRON 4 MG PO TBDP
4.0000 mg | ORAL_TABLET | Freq: Three times a day (TID) | ORAL | 0 refills | Status: AC | PRN
Start: 2021-11-06 — End: ?

## 2021-11-06 MED ORDER — HALOPERIDOL LACTATE 5 MG/ML IJ SOLN
2.5000 mg | Freq: Once | INTRAMUSCULAR | Status: AC
  Administered 2021-11-06: 2.5 mg via INTRAVENOUS

## 2021-11-06 MED ORDER — DICYCLOMINE HCL 10 MG PO CAPS: 10.0000 mg | ORAL_CAPSULE | Freq: Once | ORAL | Status: DC

## 2021-11-06 NOTE — ED Notes (Signed)
Pt now complaining of upper mid abdominal pain. EKG already done, NSR on monitor at this time. EDP informed.

## 2021-11-06 NOTE — ED Notes (Signed)
Pt to ED with husband and son, brought in [place] by husband. Pt takes multiple meds including meds for Shrograns syndrome, took CBD gummie at 10pm last night and woke up this morning feeling nauseous, numb all over and lethargic, feeling worse and worse until she told husband to bring her to hospital. Provider has evaluated pt at bedside. Speech is slow, pt is oriented, NIH is negative. Has vomited twice.

## 2021-11-06 NOTE — ED Notes (Signed)
Pt to CT and back. Family at bedside.

## 2021-11-06 NOTE — ED Notes (Signed)
Pt states she feels much better. Speech sounds normal. Abdominal pain is gone. Warm blankets provided.

## 2021-11-06 NOTE — ED Provider Notes (Signed)
-----------------------------------------   4:57 PM on 11/06/2021 ----------------------------------------- Patient is feeling much better.  Work-up is reassuring including CBC CMP lipase urinalysis.  Urine drug screen positive for cannabinoids only.  CT scan of the abdomen and pelvis showed no acute abnormality.  CT scan of the head is negative.   Minna Antis, MD 11/06/21 [phone]

## 2021-11-06 NOTE — ED Notes (Signed)
RN to bedside to answer call bell. Pt complaint of abdominal pain. MD to bedside as well.

## 2021-11-06 NOTE — ED Notes (Signed)
Pt to CT

## 2021-11-06 NOTE — ED Provider Notes (Signed)
[facility] Provider Note    Event Date/Time   First MD Initiated Contact with Patient 11/06/21 1220     (approximate)   History   Emesis   HPI  Traci Lester is a 61 y.o. female here with nausea, vomiting, tingling, weakness.  History is somewhat limited due to patient's mental status on arrival.  Per report, patient woke up today and was markedly anxious, telling her husband that she felt bad and that she needed help.  She took a delta 8 gummy last night before going to bed.  She has taken this before.  She she told her husband to call EMS because she felt "horrible" but otherwise is somewhat unable to further elaborate.  She states she feels nauseous and is tingling, with numbness in her bilateral hands, feet, and face.  She feels some intermittent abdominal cramping.  She denies any other medication changes.  No focal numbness or weakness.  No history of similar reactions.     Physical Exam   Triage Vital Signs: ED Triage Vitals  Enc Vitals Group     BP 11/06/21 1153 (!) 142/100     Pulse Rate 11/06/21 1153 (!) 107     Resp 11/06/21 1153 20     Temp 11/06/21 1153 98 F (36.7 C)     Temp Source 11/06/21 1153 Axillary     SpO2 11/06/21 1153 100 %     Weight 11/06/21 1149 115 lb (52.2 kg)     Height 11/06/21 1149 5\' 7"  (1.702 m)     Head Circumference --      Peak Flow --      Pain Score 11/06/21 1149 0     Pain Loc --      Pain Edu? --      Excl. in GC? --     Most recent vital signs: Vitals:   11/06/21 1445 11/06/21 1530  BP:  114/81  Pulse: 94 98  Resp: (!) 21 14  Temp:    SpO2: 98% 99%     General: Awake, anxious, tearful. CV:  Good peripheral perfusion.  Regular rate and rhythm.  No murmurs Resp:  Normal effort.  Lungs clear bilaterally Abd:  No distention.  No tenderness Other:  Cranial nerves II through XII intact.  Strength 5 out of 5 bilateral upper and lower extremities though somewhat limited due to effort.   Sensation grossly intact to light touch bilateral upper and lower extremities.  2+ quad reflexes bilaterally.  No clonus.   ED Results / Procedures / Treatments   Labs (all labs ordered are listed, but only abnormal results are displayed)    EKG Normal sinus rhythm, ventricular rate 91.  PR 120, QRS 90, QTc 478.  No acute ST elevations or depressions.  EKG evidence of acute ischemia or infarct   RADIOLOGY CT head: No acute abnormality CT abdomen/pelvis: Pending   I also independently reviewed and agree with radiologist interpretations.   PROCEDURES:  Critical Care performed: No  .1-3 Lead EKG Interpretation Performed by: 01/06/22, MD Authorized by: Shaune Pollack, MD     Interpretation: normal     ECG rate:  80-90s   ECG rate assessment: normal     Rhythm: sinus rhythm     Ectopy: none     Conduction: normal   Comments:     Indication: SOB    MEDICATIONS ORDERED IN ED: Medications  ondansetron (ZOFRAN) injection 4 mg (4 mg Intravenous Given 11/06/21 1252)  sodium chloride 0.9 % bolus 1,000 mL (0 mLs Intravenous Stopped 11/06/21 1410)  sodium chloride 0.9 % bolus 1,000 mL (1,000 mLs Intravenous New Bag/Given 11/06/21 1437)  diphenhydrAMINE (BENADRYL) injection 25 mg (25 mg Intravenous Given 11/06/21 1436)  haloperidol lactate (HALDOL) injection 2.5 mg (2.5 mg Intravenous Given 11/06/21 1425)  iohexol (OMNIPAQUE) 300 MG/ML solution 100 mL (100 mLs Intravenous Contrast Given 11/06/21 1453)     IMPRESSION / MDM / ASSESSMENT AND PLAN / ED COURSE  I reviewed the triage vital signs and the nursing notes.                               The patient is on the cardiac monitor to evaluate for evidence of arrhythmia and/or significant heart rate changes.   Ddx:  Differential includes the following, with pertinent life- or limb-threatening emergencies considered:  Adverse effect of delta 8 THC, panic attack, anxiety, electrolyte abnormality, hyperthyroidism, less likely  partial seizure, CVA  Patient's presentation is most consistent with acute illness / injury with system symptoms.  MDM:  61 year old female here with history of Sjogren's syndrome, fibromyalgia  here with nausea, abdominal cramping, paresthesias.  Primary suspicion is adverse effect of delta 8 THC.  Patient has a history of multiple drug allergies and sensitivity to medications, and I suspect that this along with her baseline meds has caused her symptoms.  Otherwise, she has no focal neurological deficits.  CT head reviewed and is negative.  She has normal muscle tone throughout with no evidence to suggest serotonin syndrome or NMS.  Electrolytes overall unremarkable.  She is possibly mildly dehydrated and has been given fluids.  CBC without leukocytosis or anemia.  UDS positive for THC.  Urinalysis with mild dehydration.  She did complain of abdominal cramping which again I suspect is due to the delta 8 THC, but given the degree of her symptoms, CT obtained.  CT shows no acute surgical abnormality.  Lipase is normal.  She does have a small hiatal hernia.  We will plan to give IV fluids, symptomatic control, and reassess.  Dr. Lenard Lance to f/u CT, sx control, and reassess.  MEDICATIONS GIVEN IN ED: Medications  ondansetron (ZOFRAN) injection 4 mg (4 mg Intravenous Given 11/06/21 1252)  sodium chloride 0.9 % bolus 1,000 mL (0 mLs Intravenous Stopped 11/06/21 1410)  sodium chloride 0.9 % bolus 1,000 mL (1,000 mLs Intravenous New Bag/Given 11/06/21 1437)  diphenhydrAMINE (BENADRYL) injection 25 mg (25 mg Intravenous Given 11/06/21 1436)  haloperidol lactate (HALDOL) injection 2.5 mg (2.5 mg Intravenous Given 11/06/21 1425)  iohexol (OMNIPAQUE) 300 MG/ML solution 100 mL (100 mLs Intravenous Contrast Given 11/06/21 1453)     Consults:  None   EMR reviewed  Reviewed PCP notes, including Dr. 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   Shaune Pollack, MD 11/06/21 [phone]

## 2021-11-06 NOTE — ED Notes (Signed)
Provider evaluated pt again at bedside. 

## 2021-11-06 NOTE — ED Triage Notes (Signed)
Pt woke up this AM not feeling well and ate a little bit and then threw it back up- pt became very weak and husband had to carry her to the car- pt very shaky- pt states she feels numb in her feet and hands- pt having a hard time answering questions- per husband this has never happened before

## 2021-11-15 ENCOUNTER — Other Ambulatory Visit: Payer: Self-pay | Admitting: Internal Medicine

## 2021-11-15 DIAGNOSIS — Z1231 Encounter for screening mammogram for malignant neoplasm of breast: Secondary | ICD-10-CM

## 2021-12-22 ENCOUNTER — Ambulatory Visit
Admission: RE | Admit: 2021-12-22 | Discharge: 2021-12-22 | Disposition: A | Discharge status: Home / Self Care | Payer: 59 | Source: Ambulatory Visit | Attending: Internal Medicine | Admitting: Internal Medicine

## 2021-12-22 DIAGNOSIS — Z1231 Encounter for screening mammogram for malignant neoplasm of breast: Secondary | ICD-10-CM | POA: Insufficient documentation

## 2022-11-15 ENCOUNTER — Other Ambulatory Visit: Payer: Self-pay | Admitting: Internal Medicine

## 2022-11-15 DIAGNOSIS — Z1231 Encounter for screening mammogram for malignant neoplasm of breast: Secondary | ICD-10-CM

## 2023-01-10 ENCOUNTER — Ambulatory Visit
Admission: RE | Admit: 2023-01-10 | Discharge: 2023-01-10 | Disposition: A | Discharge status: Home / Self Care | Payer: Medicare Other | Source: Ambulatory Visit | Attending: Internal Medicine | Admitting: Internal Medicine

## 2023-01-10 DIAGNOSIS — Z1231 Encounter for screening mammogram for malignant neoplasm of breast: Secondary | ICD-10-CM | POA: Diagnosis present

## 2023-09-07 IMAGING — CT CT HEAD W/O CM
4 series · 17 of 47 positions shown, 19 images · non-contrast
Comparison: 06/19/2019 MR

CLINICAL DATA: 61-year-old female with altered mental status.



[Series 2: head wo · axial · 0.42mm/px · z∈[-127,-12]mm · 7 of 31 slices shown, 9 images]
[im 4/31  brain]
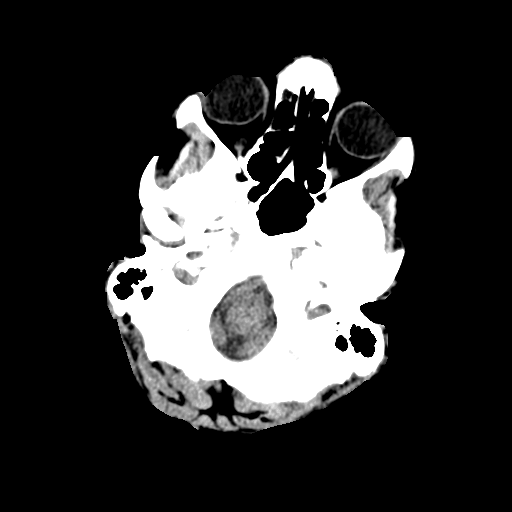
[im 4/31  bone]
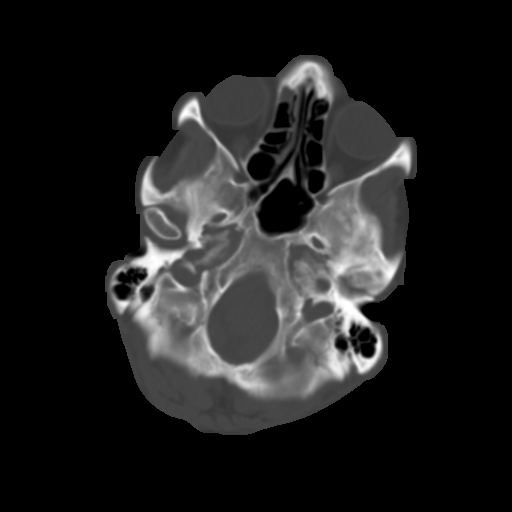
[im 8/31  brain]
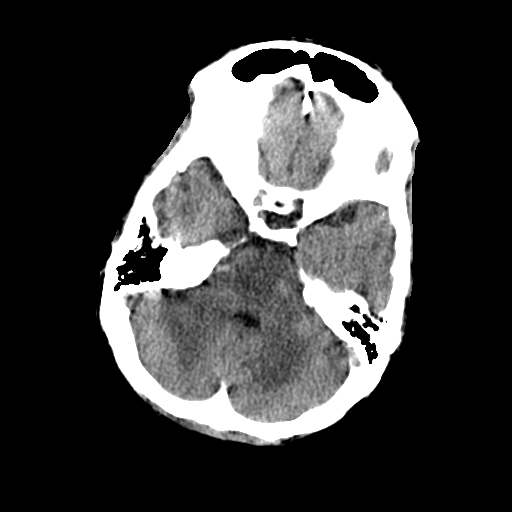
[im 12/31  brain]
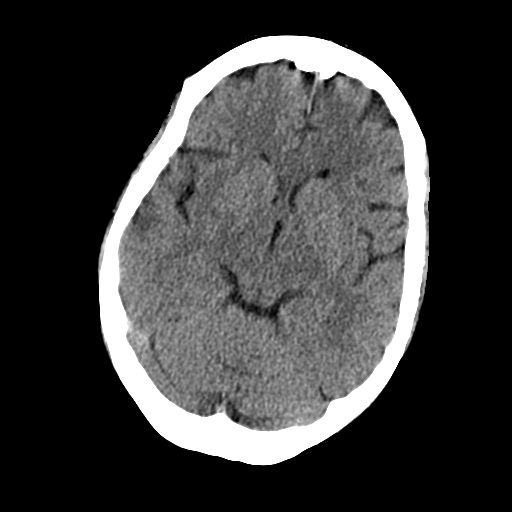
[im 16/31  brain]
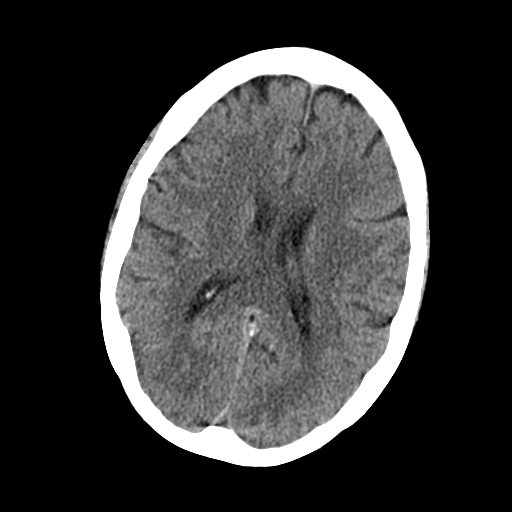
[im 19/31  brain]
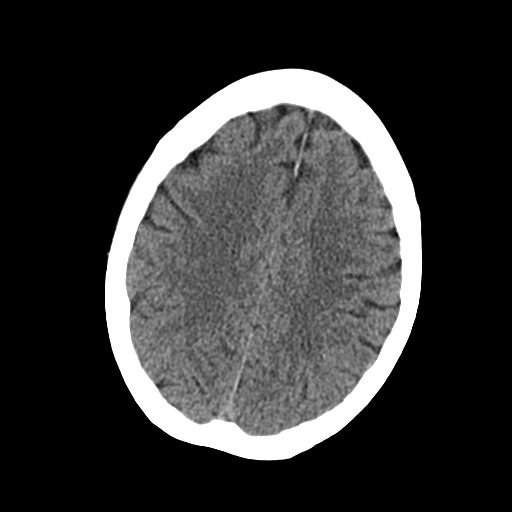
[im 19/31  bone]
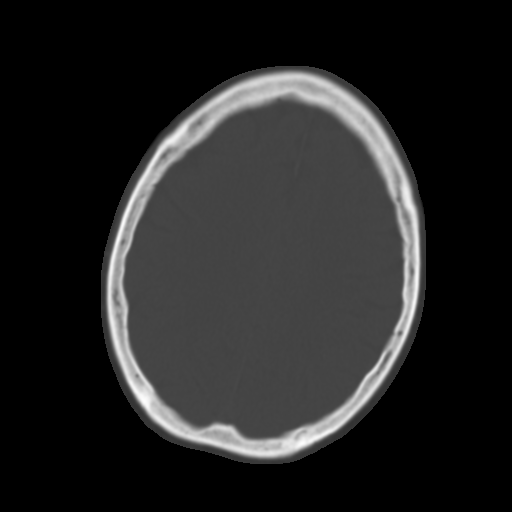
[im 23/31  brain]
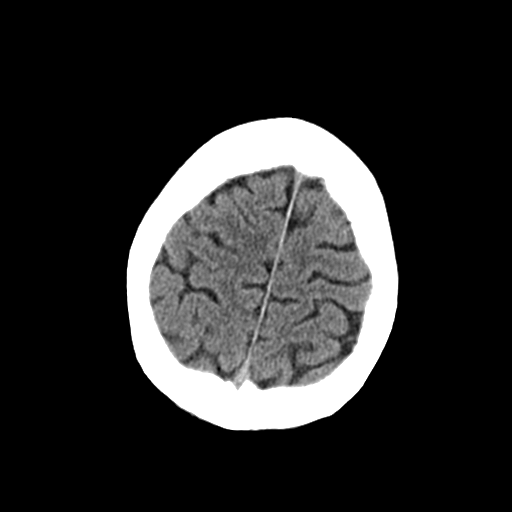
[im 27/31  brain]
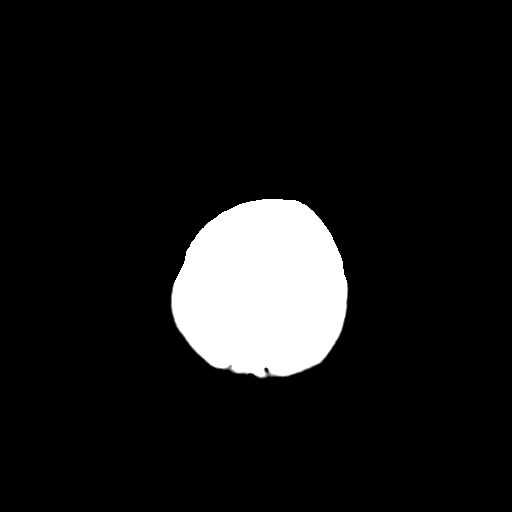

[Series 3: head bone · axial · 0.42mm/px · z∈[-128,-74]mm · 4 of 78 slices shown]
[im 8/78  bone]
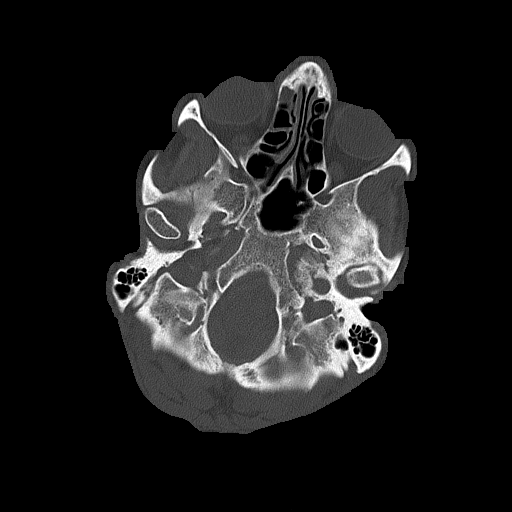
[im 16/78  bone]
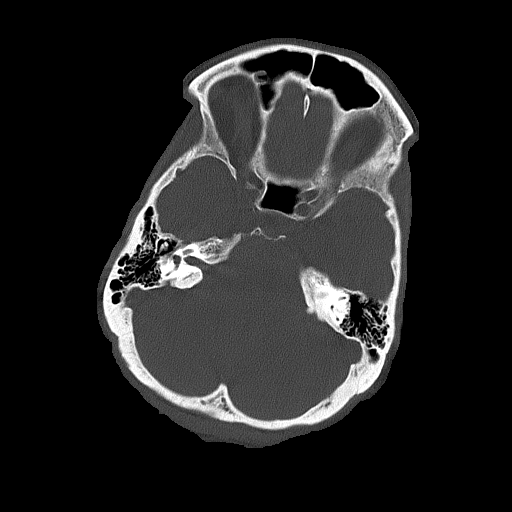
[im 24/78  bone]
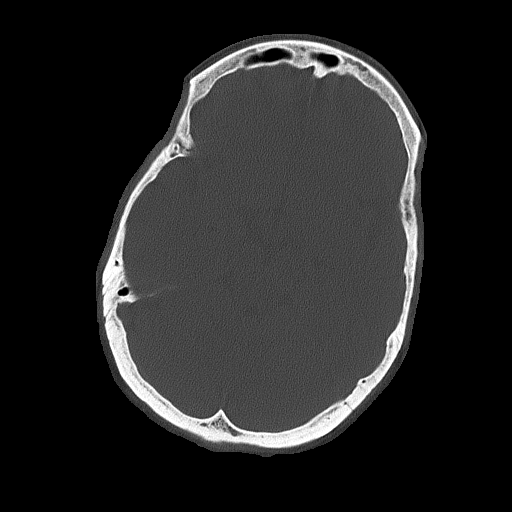
[im 35/78  bone]
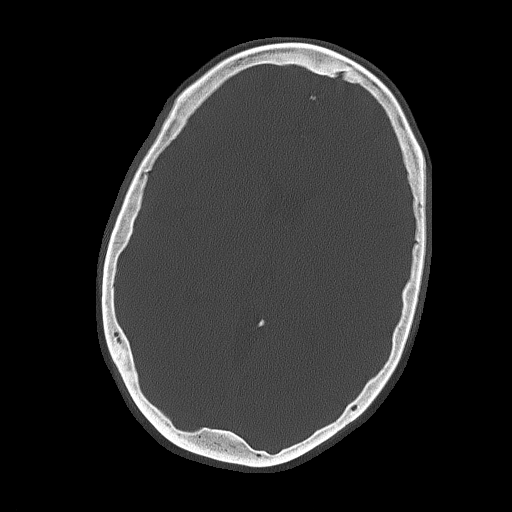

[Series 4: coronal soft tissue · coronal · 0.31mm/px · 3 of 69 slices shown]
[im 23/69  brain]
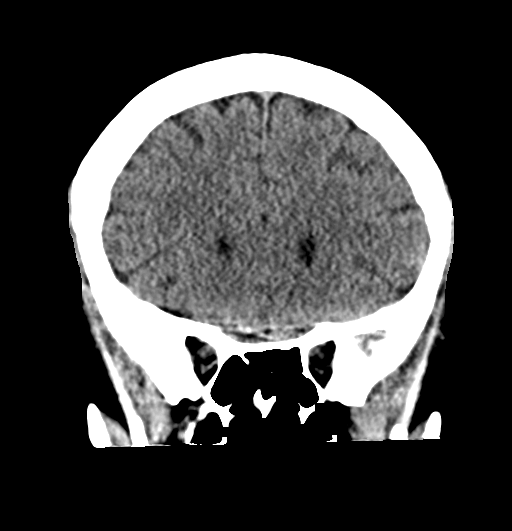
[im 31/69  brain]
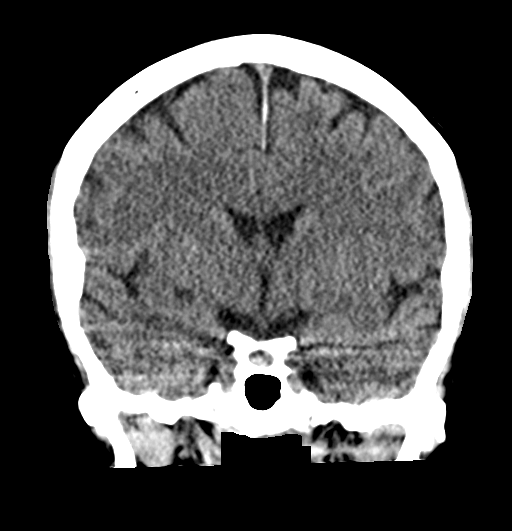
[im 38/69  brain]
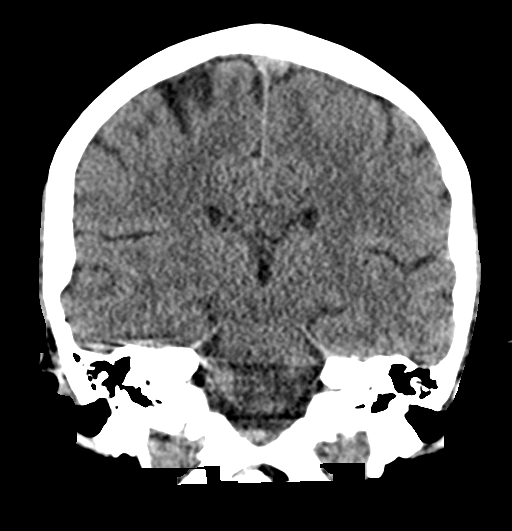

[Series 5: sagittal soft tissue · sagittal · 0.35mm/px · 3 of 53 slices shown]
[im 18/53  brain]
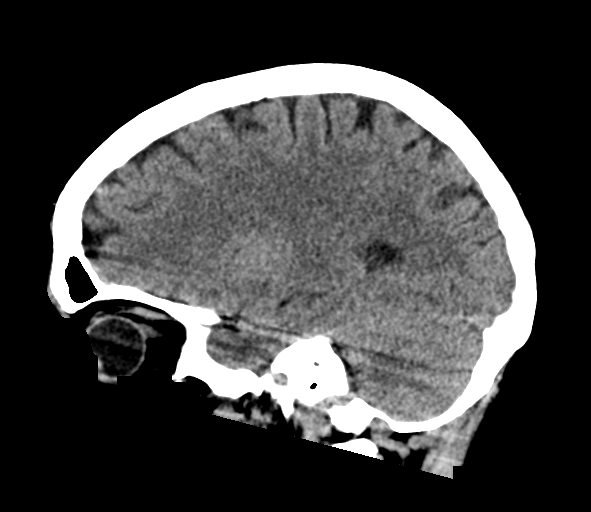
[im 27/53  brain]
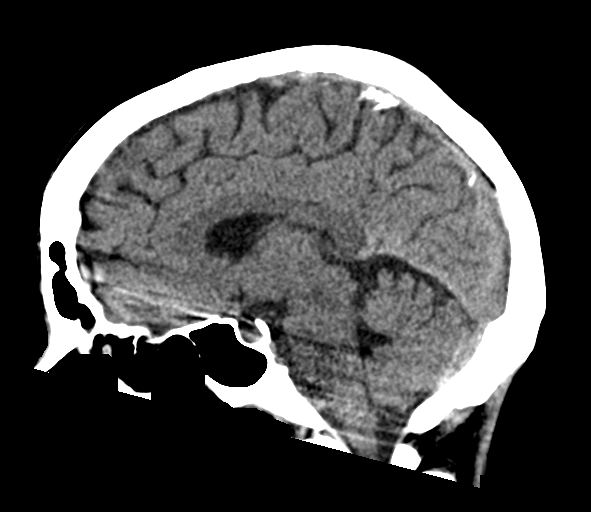
[im 35/53  brain]
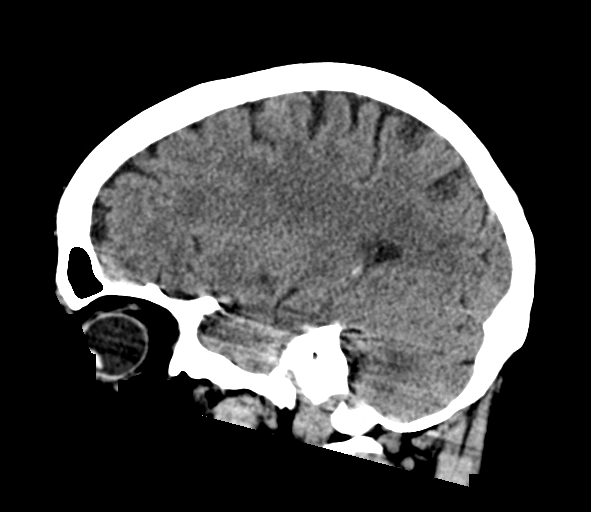

[17 of 47 positions shown; findings below may reference images not displayed]

FINDINGS: Brain: No evidence of acute infarction, hemorrhage, hydrocephalus,
extra-axial collection or mass lesion/mass effect.

Vascular: No hyperdense vessel or unexpected calcification.

Skull: Normal. Negative for fracture or focal lesion.

Sinuses/Orbits: No acute finding.

Other: None.
IMPRESSION: No evidence of acute intracranial abnormality.

## 2023-12-05 ENCOUNTER — Other Ambulatory Visit: Payer: Self-pay | Admitting: Internal Medicine

## 2023-12-05 DIAGNOSIS — Z1231 Encounter for screening mammogram for malignant neoplasm of breast: Secondary | ICD-10-CM

## 2024-01-11 ENCOUNTER — Encounter

## 2024-02-29 ENCOUNTER — Ambulatory Visit
Admission: RE | Admit: 2024-02-29 | Discharge: 2024-02-29 | Disposition: A | Discharge status: Home / Self Care | Source: Ambulatory Visit | Attending: Internal Medicine | Admitting: Internal Medicine

## 2024-02-29 DIAGNOSIS — Z1231 Encounter for screening mammogram for malignant neoplasm of breast: Secondary | ICD-10-CM | POA: Diagnosis present
